# Patient Record
Sex: Male | Born: 1990 | Race: White | Hispanic: No | Marital: Single | Smoking: Never smoker
Health system: Southern US, Community
[De-identification: ages and names within clinical notes are randomized; demographics above are authoritative.]

## PROBLEM LIST (undated history)

## (undated) DIAGNOSIS — N289 Disorder of kidney and ureter, unspecified: Secondary | ICD-10-CM

## (undated) DIAGNOSIS — I10 Essential (primary) hypertension: Secondary | ICD-10-CM

## (undated) DIAGNOSIS — K760 Fatty (change of) liver, not elsewhere classified: Secondary | ICD-10-CM

## (undated) DIAGNOSIS — J45909 Unspecified asthma, uncomplicated: Secondary | ICD-10-CM

## (undated) DIAGNOSIS — E782 Mixed hyperlipidemia: Secondary | ICD-10-CM

## (undated) DIAGNOSIS — Z905 Acquired absence of kidney: Secondary | ICD-10-CM

## (undated) HISTORY — DX: Mixed hyperlipidemia: E78.2

## (undated) HISTORY — DX: Essential (primary) hypertension: I10

## (undated) HISTORY — DX: Acquired absence of kidney: Z90.5

## (undated) HISTORY — PX: NEPHRECTOMY: SHX65

## (undated) HISTORY — DX: Fatty (change of) liver, not elsewhere classified: K76.0

---
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
## 2011-12-21 HISTORY — PX: NEPHRECTOMY: SHX65

## 2012-06-18 ENCOUNTER — Encounter (HOSPITAL_COMMUNITY): Payer: Self-pay

## 2012-06-18 ENCOUNTER — Emergency Department (INDEPENDENT_AMBULATORY_CARE_PROVIDER_SITE_OTHER)
Admission: EM | Admit: 2012-06-18 | Discharge: 2012-06-18 | Disposition: A | Discharge status: Home / Self Care | Payer: Medicaid Other | Source: Home / Self Care

## 2012-06-18 DIAGNOSIS — R42 Dizziness and giddiness: Secondary | ICD-10-CM

## 2012-06-18 DIAGNOSIS — J209 Acute bronchitis, unspecified: Secondary | ICD-10-CM

## 2012-06-18 DIAGNOSIS — R030 Elevated blood-pressure reading, without diagnosis of hypertension: Secondary | ICD-10-CM

## 2012-06-18 HISTORY — DX: Unspecified asthma, uncomplicated: J45.909

## 2012-06-18 LAB — POCT I-STAT, CHEM 8
Calcium, Ion: 1.24 mmol/L (ref 1.12–1.32)
Chloride: 102 mEq/L (ref 96–112)
Creatinine, Ser: 1 mg/dL (ref 0.50–1.35)
Glucose, Bld: 107 mg/dL — ABNORMAL HIGH (ref 70–99)
Potassium: 3.4 mEq/L — ABNORMAL LOW (ref 3.5–5.1)

## 2012-06-18 MED ORDER — ALBUTEROL SULFATE HFA 108 (90 BASE) MCG/ACT IN AERS: 1.0000 | INHALATION_SPRAY | Freq: Four times a day (QID) | RESPIRATORY_TRACT | Status: DC | PRN

## 2012-06-18 NOTE — ED Notes (Signed)
NP Chatten received EKG printout

## 2012-06-18 NOTE — ED Notes (Signed)
Pt has dizziness, fatigue and elevated b/p for one week. Grandmother thinks he has swimmers ear and he denies pain.

## 2012-06-18 NOTE — Discharge Instructions (Signed)
Acute Bronchitis You have acute bronchitis. This means you have a chest cold. The airways in your lungs are red and sore (inflamed). Acute means it is sudden onset.  CAUSES Bronchitis is most often caused by the same virus that causes a cold. SYMPTOMS   Body aches.   Chest congestion.   Chills.   Cough.   Fever.   Shortness of breath.   Sore throat.  TREATMENT  Acute bronchitis is usually treated with rest, fluids, and medicines for relief of fever or cough. Most symptoms should go away after a few days or a week. Increased fluids may help thin your secretions and will prevent dehydration. Your caregiver may give you an inhaler to improve your symptoms. The inhaler reduces shortness of breath and helps control cough. You can take over-the-counter pain relievers or cough medicine to decrease coughing, pain, or fever. A cool-air vaporizer may help thin bronchial secretions and make it easier to clear your chest. Antibiotics are usually not needed but can be prescribed if you smoke, are seriously ill, have chronic lung problems, are elderly, or you are at higher risk for developing complications.Allergies and asthma can make bronchitis worse. Repeated episodes of bronchitis may cause longstanding lung problems. Avoid smoking and secondhand smoke.Exposure to cigarette smoke or irritating chemicals will make bronchitis worse. If you are a cigarette smoker, consider using nicotine gum or skin patches to help control withdrawal symptoms. Quitting smoking will help your lungs heal faster. Recovery from bronchitis is often slow, but you should start feeling better after 2 to 3 days. Cough from bronchitis frequently lasts for 3 to 4 weeks. To prevent another bout of acute bronchitis:  Quit smoking.   Wash your hands frequently to get rid of viruses or use a hand sanitizer.   Avoid other people with cold or virus symptoms.   Try not to touch your hands to your mouth, nose, or eyes.  SEEK  IMMEDIATE MEDICAL CARE IF:  You develop increased fever, chills, or chest pain.   You have severe shortness of breath or bloody sputum.   You develop dehydration, fainting, repeated vomiting, or a severe headache.   You have no improvement after 1 week of treatment or you get worse.  MAKE SURE YOU:   Understand these instructions.   Will watch your condition.   Will get help right away if you are not doing well or get worse.  Document Released: 01/13/2005 Document Revised: 11/25/2011 Document Reviewed: 03/31/2011 [facility] Patient Information 2012 [place], [place].Dizziness Dizziness is a common problem. It is a feeling of unsteadiness or lightheadedness. You may feel like you are about to faint. Dizziness can lead to injury if you stumble or fall. A person of any age group can suffer from dizziness, but dizziness is more common in older adults. CAUSES  Dizziness can be caused by many different things, including:  Middle ear problems.   Standing for too long.   Infections.   An allergic reaction.   Aging.   An emotional response to something, such as the sight of blood.   Side effects of medicines.   Fatigue.   Problems with circulation or blood pressure.   Excess use of alcohol, medicines, or illegal drug use.   Breathing too fast (hyperventilation).   An arrhythmia or problems with your heart rhythm.   Low red blood cell count (anemia).   Pregnancy.   Vomiting, diarrhea, fever, or other illnesses that cause dehydration.   Diseases or conditions such as Parkinson's disease, high  blood pressure (hypertension), diabetes, and thyroid problems.   Exposure to extreme heat.  DIAGNOSIS  To find the cause of your dizziness, your caregiver may do a physical exam, lab tests, radiologic imaging scans, or an electrocardiography test (ECG).  TREATMENT  Treatment of dizziness depends on the cause of your symptoms and can vary greatly. HOME CARE INSTRUCTIONS   Drink  enough fluids to keep your urine clear or pale yellow. This is especially important in very hot weather. In the elderly, it is also important in cold weather.   If your dizziness is caused by medicines, take them exactly as directed. When taking blood pressure medicines, it is especially important to get up slowly.   Rise slowly from chairs and steady yourself until you feel okay.   In the morning, first sit up on the side of the bed. When this seems okay, stand slowly while holding onto something until you know your balance is fine.   If you need to stand in one place for a long time, be sure to move your legs often. Tighten and relax the muscles in your legs while standing.   If dizziness continues to be a problem, have someone stay with you for a day or two. Do this until you feel you are well enough to stay alone. Have the person call your caregiver if he or she notices changes in you that are concerning.   Do not drive or use heavy machinery if you feel dizzy.  SEEK IMMEDIATE MEDICAL CARE IF:   Your dizziness or lightheadedness gets worse.   You feel nauseous or vomit.   You develop problems with talking, walking, weakness, or using your arms, hands, or legs.   You are not thinking clearly or you have difficulty forming sentences. It may take a friend or family member to determine if your thinking is normal.   You develop chest pain, abdominal pain, shortness of breath, or sweating.   Your vision changes.   You notice any bleeding.   You have side effects from medicine that seems to be getting worse rather than better.  MAKE SURE YOU:   Understand these instructions.   Will watch your condition.   Will get help right away if you are not doing well or get worse.  Document Released: 06/01/2001 Document Revised: 11/25/2011 Document Reviewed: 06/25/2011 [facility] Patient Information 2012 [place], [place].Dizziness Dizziness is a common problem. It is a feeling of unsteadiness or  lightheadedness. You may feel like you are about to faint. Dizziness can lead to injury if you stumble or fall. A person of any age group can suffer from dizziness, but dizziness is more common in older adults. CAUSES  Dizziness can be caused by many different things, including:  Middle ear problems.   Standing for too long.   Infections.   An allergic reaction.   Aging.   An emotional response to something, such as the sight of blood.   Side effects of medicines.   Fatigue.   Problems with circulation or blood pressure.   Excess use of alcohol, medicines, or illegal drug use.   Breathing too fast (hyperventilation).   An arrhythmia or problems with your heart rhythm.   Low red blood cell count (anemia).   Pregnancy.   Vomiting, diarrhea, fever, or other illnesses that cause dehydration.   Diseases or conditions such as Parkinson's disease, high blood pressure (hypertension), diabetes, and thyroid problems.   Exposure to extreme heat.  DIAGNOSIS  To find the cause  of your dizziness, your caregiver may do a physical exam, lab tests, radiologic imaging scans, or an electrocardiography test (ECG).  TREATMENT  Treatment of dizziness depends on the cause of your symptoms and can vary greatly. HOME CARE INSTRUCTIONS   Drink enough fluids to keep your urine clear or pale yellow. This is especially important in very hot weather. In the elderly, it is also important in cold weather.   If your dizziness is caused by medicines, take them exactly as directed. When taking blood pressure medicines, it is especially important to get up slowly.   Rise slowly from chairs and steady yourself until you feel okay.   In the morning, first sit up on the side of the bed. When this seems okay, stand slowly while holding onto something until you know your balance is fine.   If you need to stand in one place for a long time, be sure to move your legs often. Tighten and relax the muscles in  your legs while standing.   If dizziness continues to be a problem, have someone stay with you for a day or two. Do this until you feel you are well enough to stay alone. Have the person call your caregiver if he or she notices changes in you that are concerning.   Do not drive or use heavy machinery if you feel dizzy.  SEEK IMMEDIATE MEDICAL CARE IF:   Your dizziness or lightheadedness gets worse.   You feel nauseous or vomit.   You develop problems with talking, walking, weakness, or using your arms, hands, or legs.   You are not thinking clearly or you have difficulty forming sentences. It may take a friend or family member to determine if your thinking is normal.   You develop chest pain, abdominal pain, shortness of breath, or sweating.   Your vision changes.   You notice any bleeding.   You have side effects from medicine that seems to be getting worse rather than better.  MAKE SURE YOU:   Understand these instructions.   Will watch your condition.   Will get help right away if you are not doing well or get worse.  Document Released: 06/01/2001 Document Revised: 11/25/2011 Document Reviewed: 06/25/2011 [facility] Patient Information 2012 [place], [place].

## 2012-06-18 NOTE — ED Provider Notes (Signed)
History     CSN: [id number]  Arrival date & time 06/18/12  1127   None     Chief Complaint  Patient presents with  . Dizziness    (Consider location/radiation/quality/duration/timing/severity/associated sxs/prior treatment) The history is provided by the patient and a relative.  Patient complains of a 3 week history of intermittent dizziness.  States no known aggravating factors, is not able to reproduce with movement, reports yesterday while in garage cleaning out freezer with his grandmother noted, dizziness and felt like he was going to pass out.  Grandmother checked blood pressure and pulse and noted both to be elevated.  Symptoms lasted less than fifteen minutes after laying down.  Denies associated chest pain/pressure, no nausea noted, denies diaphoresis, no known history of heart conditions.  Pt reports history of asthma.  Does report for the last two days he has had an intermittent cough that worsens at night and sensation of sob at night with wheezing.  Currently prescribed albuterol but not taking.   Past Medical History  Diagnosis Date  . Asthma     Past Surgical History  Procedure Date  . Nephrectomy     History reviewed. No pertinent family history.  History  Substance Use Topics  . Smoking status: Never Smoker   . Smokeless tobacco: Not on file  . Alcohol Use: No      Review of Systems  All other systems reviewed and are negative.    Allergies  Review of patient's allergies indicates no known allergies.  Home Medications   Current Outpatient Rx  Name Route Sig Dispense Refill  . ACETAMINOPHEN 325 MG PO TABS Oral Take 650 mg by mouth every 6 (six) hours as needed.    . ALBUTEROL SULFATE HFA 108 (90 BASE) MCG/ACT IN AERS Inhalation Inhale 2 puffs into the lungs every 6 (six) hours as needed.    Marland Kitchen NAPROXEN SODIUM 220 MG PO TABS Oral Take 220 mg by mouth 2 (two) times daily with a meal.      BP 147/88  Pulse 98  Temp 98.5 F (36.9 C) (Oral)  Resp  18  SpO2 98%  Physical Exam  Nursing note and vitals reviewed. Constitutional: He is oriented to person, place, and time. Vital signs are normal. He appears well-developed and well-nourished. He is active and cooperative.  HENT:  Head: Normocephalic.  Right Ear: Hearing, tympanic membrane, external ear and ear canal normal.  Left Ear: Hearing, tympanic membrane, external ear and ear canal normal.  Nose: Nose normal.  Mouth/Throat: Uvula is midline, oropharynx is clear and moist and mucous membranes are normal.  Eyes: Conjunctivae, EOM and lids are normal. Pupils are equal, round, and reactive to light. No scleral icterus.  Neck: Trachea normal and normal range of motion. Neck supple.  Cardiovascular: Normal rate, regular rhythm, normal heart sounds, intact distal pulses and normal pulses.   Pulmonary/Chest: Effort normal and breath sounds normal.  Lymphadenopathy:    He has no cervical adenopathy.  Neurological: He is alert and oriented to person, place, and time. He has normal strength. No cranial nerve deficit or sensory deficit. GCS eye subscore is 4. GCS verbal subscore is 5. GCS motor subscore is 6.  Skin: Skin is warm, dry and intact.  Psychiatric: He has a normal mood and affect. His speech is normal and behavior is normal. Judgment and thought content normal. Cognition and memory are normal.    ED Course  Procedures (including critical care time)  Labs Reviewed  POCT I-STAT,  CHEM 8 - Abnormal; Notable for the following:    Potassium 3.4 (*)     Glucose, Bld 107 (*)     All other components within normal limits   No results found.   1. Acute bronchitis   2. Elevated BP   3. Dizziness       MDM  Care discussed with Dr. Artis Flock.  Albuterol for cough, increase fluids, recheck your blood pressure in one week to ensure it is has returned to normal, follow up with pcp for further evaluation if dizziness persist, return immediately for cardiac symptoms as discussed.           Johnsie Kindred, NP 06/18/12 1434

## 2012-06-19 NOTE — ED Provider Notes (Signed)
Medical screening examination/treatment/procedure(s) were performed by resident physician or non-physician practitioner and as supervising physician I was immediately available for consultation/collaboration.   Jazalyn Mondor DOUGLAS MD.    Bryley Chrisman D Sierrah Luevano, MD 06/19/12 2024 

## 2013-06-01 ENCOUNTER — Emergency Department (HOSPITAL_BASED_OUTPATIENT_CLINIC_OR_DEPARTMENT_OTHER)
Admission: EM | Admit: 2013-06-01 | Discharge: 2013-06-01 | Disposition: A | Discharge status: Home / Self Care | Payer: Self-pay | Attending: Emergency Medicine | Admitting: Emergency Medicine

## 2013-06-01 ENCOUNTER — Emergency Department (HOSPITAL_BASED_OUTPATIENT_CLINIC_OR_DEPARTMENT_OTHER): Payer: Self-pay

## 2013-06-01 ENCOUNTER — Encounter (HOSPITAL_BASED_OUTPATIENT_CLINIC_OR_DEPARTMENT_OTHER): Payer: Self-pay | Admitting: Emergency Medicine

## 2013-06-01 DIAGNOSIS — W1809XA Striking against other object with subsequent fall, initial encounter: Secondary | ICD-10-CM | POA: Insufficient documentation

## 2013-06-01 DIAGNOSIS — Y92838 Other recreation area as the place of occurrence of the external cause: Secondary | ICD-10-CM | POA: Insufficient documentation

## 2013-06-01 DIAGNOSIS — Z23 Encounter for immunization: Secondary | ICD-10-CM | POA: Insufficient documentation

## 2013-06-01 DIAGNOSIS — Y9317 Activity, water skiing and wake boarding: Secondary | ICD-10-CM | POA: Insufficient documentation

## 2013-06-01 DIAGNOSIS — S0101XA Laceration without foreign body of scalp, initial encounter: Secondary | ICD-10-CM

## 2013-06-01 DIAGNOSIS — S0100XA Unspecified open wound of scalp, initial encounter: Secondary | ICD-10-CM | POA: Insufficient documentation

## 2013-06-01 DIAGNOSIS — Y9239 Other specified sports and athletic area as the place of occurrence of the external cause: Secondary | ICD-10-CM | POA: Insufficient documentation

## 2013-06-01 DIAGNOSIS — J45909 Unspecified asthma, uncomplicated: Secondary | ICD-10-CM | POA: Insufficient documentation

## 2013-06-01 DIAGNOSIS — Z79899 Other long term (current) drug therapy: Secondary | ICD-10-CM | POA: Insufficient documentation

## 2013-06-01 MED ORDER — TRAMADOL HCL 50 MG PO TABS
50.0000 mg | ORAL_TABLET | Freq: Once | ORAL | Status: AC
  Administered 2013-06-01: 50 mg via ORAL

## 2013-06-01 MED ORDER — TETANUS-DIPHTH-ACELL PERTUSSIS 5-2.5-18.5 LF-MCG/0.5 IM SUSP
0.5000 mL | Freq: Once | INTRAMUSCULAR | Status: AC
  Administered 2013-06-01: 0.5 mL via INTRAMUSCULAR
  Filled 2013-06-01: qty 0.5

## 2013-06-01 MED ORDER — LIDOCAINE-EPINEPHRINE 2 %-1:100000 IJ SOLN
1.7000 mL | Freq: Once | INTRAMUSCULAR | Status: AC
  Administered 2013-06-01: 1.7 mL via INTRADERMAL
  Filled 2013-06-01: qty 1

## 2013-06-01 MED ORDER — TRAMADOL HCL 50 MG PO TABS: 50.0000 mg | ORAL_TABLET | Freq: Four times a day (QID) | ORAL | Status: DC | PRN

## 2013-06-01 NOTE — ED Notes (Addendum)
4cm laceration to R temporal-occipital head, from [place]. Pt denies LOC. Tinnitis to bilateral ears initially, but not now. Pain in temporal and jaw area when opening mouth. Unknown last tetanus shot.

## 2013-06-01 NOTE — ED Notes (Signed)
Suture cart is at the bedside set up and ready for the doctor to use. 

## 2013-06-01 NOTE — ED Provider Notes (Signed)
History     CSN: [id number]  Arrival date & time 06/01/13  1246   First MD Initiated Contact with Patient 06/01/13 1257      Chief Complaint  Patient presents with  . Ear Laceration    (Consider location/radiation/quality/duration/timing/severity/associated sxs/prior treatment) HPI Comments: Patient is a 22 year old male who presents with a scalp laceration that occurred prior to arrival when he was wake boarding. Patient reports wake boarding and falling, striking his head on the wake board. Patient denies LOC. He reports a laceration of the right side of his head with associated bleeding. The area is painful described as throbbing and severe. No alleviating factors. Palpation of the area makes the pain worse.    Past Medical History  Diagnosis Date  . Asthma     Past Surgical History  Procedure Laterality Date  . Nephrectomy      No family history on file.  History  Substance Use Topics  . Smoking status: Never Smoker   . Smokeless tobacco: Not on file  . Alcohol Use: No      Review of Systems  Skin: Positive for wound.  All other systems reviewed and are negative.    Allergies  Review of patient's allergies indicates no known allergies.  Home Medications   Current Outpatient Rx  Name  Route  Sig  Dispense  Refill  . acetaminophen (TYLENOL) 325 MG tablet   Oral   Take 650 mg by mouth every 6 (six) hours as needed.         Marland Kitchen albuterol (PROVENTIL HFA;VENTOLIN HFA) 108 (90 BASE) MCG/ACT inhaler   Inhalation   Inhale 2 puffs into the lungs every 6 (six) hours as needed.         Marland Kitchen albuterol (PROVENTIL HFA;VENTOLIN HFA) 108 (90 BASE) MCG/ACT inhaler   Inhalation   Inhale 1-2 puffs into the lungs every 6 (six) hours as needed for wheezing.   1 Inhaler   2     BP 140/87  Pulse 86  Temp(Src) 98.1 F (36.7 C) (Oral)  Resp 16  SpO2 100%  Physical Exam  Nursing note and vitals reviewed. Constitutional: He is oriented to person, place, and time.  He appears well-developed and well-nourished. No distress.  HENT:  Head: Normocephalic and atraumatic.  3.5 cm laceration to right temporal region. Bleeding controlled. Surrounding area tender to palpation.   Eyes: Conjunctivae and EOM are normal.  Neck: Normal range of motion.  Cardiovascular: Normal rate and regular rhythm.  Exam reveals no gallop and no friction rub.   No murmur heard. Pulmonary/Chest: Effort normal and breath sounds normal. He has no wheezes. He has no rales. He exhibits no tenderness.  Abdominal: Soft. There is no tenderness.  Musculoskeletal: Normal range of motion.  Neurological: He is alert and oriented to person, place, and time. Coordination normal.  Speech is goal-oriented. Moves limbs without ataxia.   Skin: Skin is warm and dry.  Psychiatric: He has a normal mood and affect. His behavior is normal.    ED Course  Procedures (including critical care time)  LACERATION REPAIR Performed by: Emilia Beck Authorized by: Emilia Beck Consent: Verbal consent obtained. Risks and benefits: risks, benefits and alternatives were discussed Consent given by: patient Patient identity confirmed: provided demographic data Prepped and Draped in normal sterile fashion Wound explored  Laceration Location: right temporal area  Laceration Length: 3.5 cm  No Foreign Bodies seen or palpated  Anesthesia: local infiltration  Local anesthetic: lidocaine 2% with epinephrine  Anesthetic total: 2 ml  Irrigation method: syringe Amount of cleaning: standard  Skin closure: staples  Number of sutures: 4 staples  Technique: n/a  Patient tolerance: Patient tolerated the procedure well with no immediate complications.   Labs Reviewed - No data to display Ct Head Wo Contrast  06/01/2013   *RADIOLOGY REPORT*  Clinical Data:  Hit on side of head with a wake board.  Head pain. Facial pain.  Painful to open and close mouth.  CT HEAD WITHOUT CONTRAST CT  MAXILLOFACIAL WITHOUT CONTRAST  Technique:  Multidetector CT imaging of the head and maxillofacial structures were performed using the standard protocol without intravenous contrast. Multiplanar CT image reconstructions of the maxillofacial structures were also generated.  Comparison:   None.  CT HEAD  Findings: There is no evidence for acute infarction, intracranial hemorrhage, mass lesion, hydrocephalus, or extra-axial fluid. There is no atrophy or white matter disease.  Multiple staples are associated with a right temporal laceration and scalp hematoma. There is no underlying skull fracture.  Sinuses and mastoids demonstrate no acute air-fluid levels.  IMPRESSION: Right temporal scalp laceration.  No skull fracture or intracranial hemorrhage.  CT MAXILLOFACIAL  Findings:   There is no visible facial fracture or dislocation. Mandible, maxilla, and temporomandibular joints are intact.  There is mild fluid accumulation in the left frontal, left ethmoid, left sphenoid, and left maxillary sinuses consistent with chronic sinus disease rather than an acute abnormality.  There is no inferior or medial blowout fracture.  The orbits are negative.  Parapharyngeal fat planes are preserved.  Airway is midline.  No nasal cavity masses.  IMPRESSION: No visible facial fracture. There is no TMJ displacement.  Chronic sinus disease on the left.   Original Report Authenticated By: Davonna Belling, M.D.   Ct Maxillofacial Wo Cm  06/01/2013   *RADIOLOGY REPORT*  Clinical Data:  Hit on side of head with a wake board.  Head pain. Facial pain.  Painful to open and close mouth.  CT HEAD WITHOUT CONTRAST CT MAXILLOFACIAL WITHOUT CONTRAST  Technique:  Multidetector CT imaging of the head and maxillofacial structures were performed using the standard protocol without intravenous contrast. Multiplanar CT image reconstructions of the maxillofacial structures were also generated.  Comparison:   None.  CT HEAD  Findings: There is no evidence for  acute infarction, intracranial hemorrhage, mass lesion, hydrocephalus, or extra-axial fluid. There is no atrophy or white matter disease.  Multiple staples are associated with a right temporal laceration and scalp hematoma. There is no underlying skull fracture.  Sinuses and mastoids demonstrate no acute air-fluid levels.  IMPRESSION: Right temporal scalp laceration.  No skull fracture or intracranial hemorrhage.  CT MAXILLOFACIAL  Findings:   There is no visible facial fracture or dislocation. Mandible, maxilla, and temporomandibular joints are intact.  There is mild fluid accumulation in the left frontal, left ethmoid, left sphenoid, and left maxillary sinuses consistent with chronic sinus disease rather than an acute abnormality.  There is no inferior or medial blowout fracture.  The orbits are negative.  Parapharyngeal fat planes are preserved.  Airway is midline.  No nasal cavity masses.  IMPRESSION: No visible facial fracture. There is no TMJ displacement.  Chronic sinus disease on the left.   Original Report Authenticated By: Davonna Belling, M.D.     1. Scalp laceration, initial encounter       MDM  1:18 PM Patient's head laceration will be cleaned and stapled. Patient will have tramadol for pain. Patient denies LOC. I will  not order CT scan at this time.   1:34 PM Laceration repaired without difficulty. Patient will have TDap. Patient instructed to return in 5 days for staple removal. Vitals stable and patient afebrile. Patient instructed to return with worsening or concerning symptoms.   1:54 PM Patient now complaining of jaw pain. Patient will have head and face CT for further evaluation.   2:36 PM Imaging unremarkable for acute changes. Patient will be discharged without further evaluation.   Emilia Beck, PA-C 06/01/13 1337  Emilia Beck, PA-C 06/01/13 1436

## 2013-06-01 NOTE — ED Provider Notes (Signed)
Medical screening examination/treatment/procedure(s) were performed by non-physician practitioner and as supervising physician I was immediately available for consultation/collaboration.  Vona Whiters, MD 06/01/13 1451 

## 2013-06-06 ENCOUNTER — Emergency Department (HOSPITAL_BASED_OUTPATIENT_CLINIC_OR_DEPARTMENT_OTHER)
Admission: EM | Admit: 2013-06-06 | Discharge: 2013-06-06 | Disposition: A | Discharge status: Home / Self Care | Payer: Self-pay | Attending: Emergency Medicine | Admitting: Emergency Medicine

## 2013-06-06 ENCOUNTER — Encounter (HOSPITAL_BASED_OUTPATIENT_CLINIC_OR_DEPARTMENT_OTHER): Payer: Self-pay

## 2013-06-06 DIAGNOSIS — Z905 Acquired absence of kidney: Secondary | ICD-10-CM | POA: Insufficient documentation

## 2013-06-06 DIAGNOSIS — J45909 Unspecified asthma, uncomplicated: Secondary | ICD-10-CM | POA: Insufficient documentation

## 2013-06-06 DIAGNOSIS — Z4802 Encounter for removal of sutures: Secondary | ICD-10-CM | POA: Insufficient documentation

## 2013-06-06 DIAGNOSIS — Z79899 Other long term (current) drug therapy: Secondary | ICD-10-CM | POA: Insufficient documentation

## 2013-06-06 NOTE — ED Provider Notes (Signed)
Medical screening examination/treatment/procedure(s) were performed by non-physician practitioner and as supervising physician I was immediately available for consultation/collaboration.   Niccolo Burggraf J. Gizzelle Lacomb, MD 06/06/13 1804 

## 2013-06-06 NOTE — ED Provider Notes (Signed)
History     CSN: [id number]  Arrival date & time 06/06/13  1618   First MD Initiated Contact with Patient 06/06/13 1627      Chief Complaint  Patient presents with  . Wound Check    (Consider location/radiation/quality/duration/timing/severity/associated sxs/prior treatment) HPI Comments: Pt has staples placed 5 days ago to his scalp:pt denies any problems to the area  The history is provided by the patient. No language interpreter was used.    Past Medical History  Diagnosis Date  . Asthma     Past Surgical History  Procedure Laterality Date  . Nephrectomy      History reviewed. No pertinent family history.  History  Substance Use Topics  . Smoking status: Never Smoker   . Smokeless tobacco: Not on file  . Alcohol Use: No      Review of Systems  Constitutional: Negative.   Respiratory: Negative.   Cardiovascular: Negative.     Allergies  Review of patient's allergies indicates no known allergies.  Home Medications   Current Outpatient Rx  Name  Route  Sig  Dispense  Refill  . acetaminophen (TYLENOL) 325 MG tablet   Oral   Take 650 mg by mouth every 6 (six) hours as needed.         Marland Kitchen albuterol (PROVENTIL HFA;VENTOLIN HFA) 108 (90 BASE) MCG/ACT inhaler   Inhalation   Inhale 2 puffs into the lungs every 6 (six) hours as needed.         Marland Kitchen albuterol (PROVENTIL HFA;VENTOLIN HFA) 108 (90 BASE) MCG/ACT inhaler   Inhalation   Inhale 1-2 puffs into the lungs every 6 (six) hours as needed for wheezing.   1 Inhaler   2   . traMADol (ULTRAM) 50 MG tablet   Oral   Take 1 tablet (50 mg total) by mouth every 6 (six) hours as needed for pain.   15 tablet   0     BP 158/81  Pulse 87  Temp(Src) 99.6 F (37.6 C) (Oral)  Resp 16  Ht 5\' 5"  (1.651 m)  Wt 140 lb (63.504 kg)  BMI 23.3 kg/m2  SpO2 98%  Physical Exam  Nursing note and vitals reviewed. Constitutional: He appears well-developed and well-nourished.  Cardiovascular: Normal rate and  regular rhythm.   Pulmonary/Chest: Effort normal and breath sounds normal.  Skin:  Well healing wound to the right scalp    ED Course  SUTURE REMOVAL Date/Time: 06/06/2013 4:34 PM Performed by: Teressa Lower Authorized by: Teressa Lower Time out: Immediately prior to procedure a "time out" was called to verify the correct patient, procedure, equipment, support staff and site/side marked as required. Body area: head/neck Location details: scalp Wound Appearance: clean Staples Removed: 4 Facility: sutures placed in this facility Patient tolerance: Patient tolerated the procedure well with no immediate complications.   (including critical care time)  Labs Reviewed - No data to display No results found.   1. Removal of staple       MDM  Staples removed:no infection noted        Teressa Lower, NP 06/06/13 1635

## 2013-06-06 NOTE — ED Notes (Signed)
Pt presents today for staple removal, wound well approximated, healing well.
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
## 2016-12-20 HISTORY — PX: WISDOM TOOTH EXTRACTION: SHX21

## 2017-12-08 ENCOUNTER — Encounter (HOSPITAL_BASED_OUTPATIENT_CLINIC_OR_DEPARTMENT_OTHER): Payer: Self-pay

## 2017-12-08 ENCOUNTER — Other Ambulatory Visit: Payer: Self-pay

## 2017-12-08 ENCOUNTER — Emergency Department (HOSPITAL_BASED_OUTPATIENT_CLINIC_OR_DEPARTMENT_OTHER)
Admission: EM | Admit: 2017-12-08 | Discharge: 2017-12-08 | Disposition: A | Discharge status: Home / Self Care | Payer: BLUE CROSS/BLUE SHIELD | Attending: Emergency Medicine | Admitting: Emergency Medicine

## 2017-12-08 DIAGNOSIS — K029 Dental caries, unspecified: Secondary | ICD-10-CM | POA: Insufficient documentation

## 2017-12-08 DIAGNOSIS — J45909 Unspecified asthma, uncomplicated: Secondary | ICD-10-CM | POA: Insufficient documentation

## 2017-12-08 DIAGNOSIS — K0889 Other specified disorders of teeth and supporting structures: Secondary | ICD-10-CM | POA: Diagnosis present

## 2017-12-08 MED ORDER — LIDOCAINE VISCOUS 2 % MT SOLN: 15.0000 mL | Freq: Once | OROMUCOSAL | Status: DC

## 2017-12-08 MED ORDER — PENICILLIN V POTASSIUM 250 MG PO TABS
500.0000 mg | ORAL_TABLET | Freq: Once | ORAL | Status: AC
  Administered 2017-12-08: 500 mg via ORAL
  Filled 2017-12-08: qty 2

## 2017-12-08 MED ORDER — PENICILLIN V POTASSIUM 500 MG PO TABS: 500.0000 mg | ORAL_TABLET | Freq: Four times a day (QID) | ORAL | 0 refills | Status: AC

## 2017-12-08 MED ORDER — IBUPROFEN 800 MG PO TABS: 800.0000 mg | ORAL_TABLET | Freq: Once | ORAL | Status: DC

## 2017-12-08 NOTE — ED Notes (Signed)
Prior to arrival, pt's girlfriend gave 2 tylenol and then apparently "got scared" d/t patient's pain, and so she decided to give him an adult dose of the liquid tylenol as well.  Thoroughly advised pt and girlfriend against double dosing on medications and to take an appropriate dose from now on. EMT and RN at bedside for help with holding pt still for dental block. Upon further examination, EDP found several broken teeth as well as a necrotic tooth with the root exposed.  Pt is advised to call a dentist and try to get in as soon as possible; they state they will try the emergency dentist today.  Deny any further needs at this time.

## 2017-12-08 NOTE — ED Provider Notes (Signed)
[facility] EMERGENCY DEPARTMENT Provider Note   CSN: [id number] Arrival date & time: 12/08/17  [id number]     History   Chief Complaint No chief complaint on file.   HPI Peter Meadows is a 26 y.o. male.  The history is provided by the patient.  Dental Pain   This is a new problem. The current episode started 1 to 2 hours ago. The problem occurs constantly. The problem has not changed since onset.The pain is at a severity of 10/10. The pain is severe. He has tried acetaminophen for the symptoms. The treatment provided no relief.  Knew that he had bad wisdom teeth but has not followed up.    Past Medical History:  Diagnosis Date  . Asthma     There are no active problems to display for this patient.   Past Surgical History:  Procedure Laterality Date  . NEPHRECTOMY         Home Medications    Prior to Admission medications   Medication Sig Start Date End Date Taking? Authorizing Provider  acetaminophen (TYLENOL) 325 MG tablet Take 650 mg by mouth every 6 (six) hours as needed.    [provider]  albuterol (PROVENTIL HFA;VENTOLIN HFA) 108 (90 BASE) MCG/ACT inhaler Inhale 2 puffs into the lungs every 6 (six) hours as needed.    [provider]  albuterol (PROVENTIL HFA;VENTOLIN HFA) 108 (90 BASE) MCG/ACT inhaler Inhale 1-2 puffs into the lungs every 6 (six) hours as needed for wheezing. 06/18/12 06/18/13  Chatten, Katherine Bassetarmen L, NP  traMADol (ULTRAM) 50 MG tablet Take 1 tablet (50 mg total) by mouth every 6 (six) hours as needed for pain. 06/01/13   Emilia BeckSzekalski, Kaitlyn, PA-C    Family History No family history on file.  Social History Social History   Tobacco Use  . Smoking status: Never Smoker  Substance Use Topics  . Alcohol use: No  . Drug use: No     Allergies   Patient has no known allergies.   Review of Systems Review of Systems  Constitutional: Negative for appetite change and fever.  HENT: Positive for dental problem.  Negative for drooling, facial swelling, mouth sores and postnasal drip.   Eyes: Negative for photophobia.  Respiratory: Negative for shortness of breath.   Gastrointestinal: Negative for abdominal pain.  Musculoskeletal: Negative for neck pain.  All other systems reviewed and are negative.    Physical Exam Updated Vital Signs There were no vitals taken for this visit.  Physical Exam  Constitutional: He is oriented to person, place, and time. He appears well-developed and well-nourished. No distress.  HENT:  Head: Normocephalic and atraumatic.  Mouth/Throat: No trismus in the jaw. Abnormal dentition. Dental caries present. No uvula swelling. No oropharyngeal exudate.    Normal jaw excursion no pain with palpation of the jaw.  Midface is stable and non tender  Eyes: Pupils are equal, round, and reactive to light.  Neck: Normal range of motion. Neck supple. No tracheal deviation present.  Cardiovascular: Normal rate, regular rhythm, normal heart sounds and intact distal pulses.  Pulmonary/Chest: Effort normal and breath sounds normal. He has no wheezes.  Abdominal: Soft. Bowel sounds are normal. There is no tenderness. There is no guarding.  Musculoskeletal: Normal range of motion.  Lymphadenopathy:    He has no cervical adenopathy.  Neurological: He is alert and oriented to person, place, and time. He displays normal reflexes.  Skin: Skin is warm and dry. Capillary refill takes less than 2 seconds.  Nursing note and vitals reviewed.    ED Treatments / Results    Procedures Dental Block Date/Time: 12/08/2017 5:37 AM Performed by: Cy BlamerPalumbo, Alivea Gladson, MD Authorized by: Cy BlamerPalumbo, Lurleen Soltero, MD   Consent:    Consent obtained:  Verbal   Consent given by:  Patient   Risks discussed:  Infection, hematoma and intravascular injection   Alternatives discussed:  No treatment Indications:    Indications: dental pain   Location:    Block type:  Posterior superior alveolar Procedure  details (see MAR for exact dosages):    Topical anesthetic:  Lidocaine gel   Syringe type:  Controlled syringe   Needle gauge:  27 G   Anesthetic injected:  Bupivacaine 0.25% WITH epi   Injection procedure:  Anatomic landmarks identified, introduced needle, negative aspiration for blood, anatomic landmarks palpated and incremental injection Post-procedure details:    Outcome:  Anesthesia achieved   Patient tolerance of procedure:  Tolerated well, no immediate complications   (including critical care time)  Medications Ordered in ED Medications  penicillin v potassium (VEETID) tablet 500 mg (not administered)  ibuprofen (ADVIL,MOTRIN) tablet 800 mg (not administered)      Final Clinical Impressions(s) / ED Diagnoses   Follow up with dentistry for ongoing care. Started antibiotics.  Return for facial swelling, neck swelling, fevers, inability to open the  vomiting, or diarrhea, abdominal pain, Inability to tolerate liquids or food, cough, altered mental status or any concerns. No signs of systemic illness or infection. The patient is nontoxic-appearing on exam and vital signs are within normal limits.    I have reviewed the triage vital signs and the nursing notes. Pertinent labs &imaging results that were available during my care of the patient were reviewed by me and considered in my medical decision making (see chart for details).  After history, exam, and medical workup I feel the patient has been appropriately medically screened and is safe for discharge home. Pertinent diagnoses were discussed with the patient. Patient was given return precautions   Aletheia Tangredi, MD 12/08/17 [id number]

## 2017-12-08 NOTE — ED Triage Notes (Signed)
Pt c/o right side dental pain that woke him from sleep, took a double dose of tylenol and 2 ibuprofen prior to arrival without relief

## 2018-04-07 ENCOUNTER — Emergency Department (HOSPITAL_BASED_OUTPATIENT_CLINIC_OR_DEPARTMENT_OTHER)
Admission: EM | Admit: 2018-04-07 | Discharge: 2018-04-07 | Disposition: A | Discharge status: Home / Self Care | Payer: BLUE CROSS/BLUE SHIELD | Attending: Physician Assistant | Admitting: Physician Assistant

## 2018-04-07 ENCOUNTER — Other Ambulatory Visit: Payer: Self-pay

## 2018-04-07 ENCOUNTER — Encounter (HOSPITAL_BASED_OUTPATIENT_CLINIC_OR_DEPARTMENT_OTHER): Payer: Self-pay

## 2018-04-07 ENCOUNTER — Emergency Department (HOSPITAL_BASED_OUTPATIENT_CLINIC_OR_DEPARTMENT_OTHER): Payer: BLUE CROSS/BLUE SHIELD

## 2018-04-07 DIAGNOSIS — Z79899 Other long term (current) drug therapy: Secondary | ICD-10-CM | POA: Insufficient documentation

## 2018-04-07 DIAGNOSIS — Y999 Unspecified external cause status: Secondary | ICD-10-CM | POA: Insufficient documentation

## 2018-04-07 DIAGNOSIS — Y929 Unspecified place or not applicable: Secondary | ICD-10-CM | POA: Diagnosis not present

## 2018-04-07 DIAGNOSIS — J45909 Unspecified asthma, uncomplicated: Secondary | ICD-10-CM | POA: Insufficient documentation

## 2018-04-07 DIAGNOSIS — Z23 Encounter for immunization: Secondary | ICD-10-CM | POA: Diagnosis not present

## 2018-04-07 DIAGNOSIS — Z203 Contact with and (suspected) exposure to rabies: Secondary | ICD-10-CM | POA: Insufficient documentation

## 2018-04-07 DIAGNOSIS — Y939 Activity, unspecified: Secondary | ICD-10-CM | POA: Diagnosis not present

## 2018-04-07 DIAGNOSIS — W540XXA Bitten by dog, initial encounter: Secondary | ICD-10-CM | POA: Diagnosis not present

## 2018-04-07 DIAGNOSIS — T148XXA Other injury of unspecified body region, initial encounter: Secondary | ICD-10-CM

## 2018-04-07 DIAGNOSIS — S61051A Open bite of right thumb without damage to nail, initial encounter: Secondary | ICD-10-CM | POA: Diagnosis not present

## 2018-04-07 LAB — CBC WITH DIFFERENTIAL/PLATELET
BASOS PCT: 0 %
Basophils Absolute: 0 10*3/uL (ref 0.0–0.1)
Eosinophils Absolute: 0.3 10*3/uL (ref 0.0–0.7)
Eosinophils Relative: 3 %
HEMATOCRIT: 41.4 % (ref 39.0–52.0)
Hemoglobin: 14.4 g/dL (ref 13.0–17.0)
Lymphocytes Relative: 25 %
Lymphs Abs: 2.4 10*3/uL (ref 0.7–4.0)
MCH: 29.6 pg (ref 26.0–34.0)
MCHC: 34.8 g/dL (ref 30.0–36.0)
MCV: 85 fL (ref 78.0–100.0)
MONO ABS: 1 10*3/uL (ref 0.1–1.0)
MONOS PCT: 10 %
NEUTROS ABS: 6.1 10*3/uL (ref 1.7–7.7)
Neutrophils Relative %: 62 %
Platelets: 207 10*3/uL (ref 150–400)
RBC: 4.87 MIL/uL (ref 4.22–5.81)
RDW: 13.5 % (ref 11.5–15.5)
WBC: 9.8 10*3/uL (ref 4.0–10.5)

## 2018-04-07 LAB — COMPREHENSIVE METABOLIC PANEL
ALBUMIN: 4.5 g/dL (ref 3.5–5.0)
ALK PHOS: 76 U/L (ref 38–126)
ALT: 60 U/L (ref 17–63)
AST: 41 U/L (ref 15–41)
Anion gap: 8 (ref 5–15)
BUN: 16 mg/dL (ref 6–20)
CALCIUM: 8.9 mg/dL (ref 8.9–10.3)
CO2: 22 mmol/L (ref 22–32)
Chloride: 106 mmol/L (ref 101–111)
Creatinine, Ser: 1.07 mg/dL (ref 0.61–1.24)
GFR calc Af Amer: >60 mL/min (ref >60)
GFR calc non Af Amer: >60 mL/min (ref >60)
GLUCOSE: 111 mg/dL — AB (ref 65–99)
Potassium: 3.9 mmol/L (ref 3.5–5.1)
SODIUM: 136 mmol/L (ref 135–145)
Total Bilirubin: 1.2 mg/dL (ref 0.3–1.2)
Total Protein: 7.6 g/dL (ref 6.5–8.1)

## 2018-04-07 MED ORDER — SODIUM CHLORIDE 0.9 % IV BOLUS
500.0000 mL | Freq: Once | INTRAVENOUS | Status: AC
  Administered 2018-04-07: 500 mL via INTRAVENOUS

## 2018-04-07 MED ORDER — RABIES VACCINE, PCEC IM SUSR
1.0000 mL | Freq: Once | INTRAMUSCULAR | Status: AC
  Administered 2018-04-07: 1 mL via INTRAMUSCULAR
  Filled 2018-04-07: qty 1

## 2018-04-07 MED ORDER — PENTAFLUOROPROP-TETRAFLUOROETH EX AERO: INHALATION_SPRAY | CUTANEOUS | Status: DC | PRN

## 2018-04-07 MED ORDER — PIPERACILLIN-TAZOBACTAM 3.375 G IVPB 30 MIN
3.3750 g | Freq: Once | INTRAVENOUS | Status: AC
  Administered 2018-04-07: 3.375 g via INTRAVENOUS
  Filled 2018-04-07 (×2): qty 50

## 2018-04-07 MED ORDER — PENTAFLUOROPROP-TETRAFLUOROETH EX AERO
INHALATION_SPRAY | CUTANEOUS | Status: AC
  Filled 2018-04-07: qty 30

## 2018-04-07 MED ORDER — AMOXICILLIN-POT CLAVULANATE 875-125 MG PO TABS: 1.0000 | ORAL_TABLET | Freq: Two times a day (BID) | ORAL | 0 refills | Status: DC

## 2018-04-07 MED ORDER — TETANUS-DIPHTH-ACELL PERTUSSIS 5-2.5-18.5 LF-MCG/0.5 IM SUSP
0.5000 mL | Freq: Once | INTRAMUSCULAR | Status: AC
  Administered 2018-04-07: 0.5 mL via INTRAMUSCULAR
  Filled 2018-04-07: qty 0.5

## 2018-04-07 MED ORDER — AMOXICILLIN-POT CLAVULANATE 875-125 MG PO TABS
1.0000 | ORAL_TABLET | Freq: Once | ORAL | Status: AC
  Administered 2018-04-07: 1 via ORAL
  Filled 2018-04-07: qty 1

## 2018-04-07 MED ORDER — RABIES IMMUNE GLOBULIN 150 UNIT/ML IM INJ
20.0000 [IU]/kg | INJECTION | Freq: Once | INTRAMUSCULAR | Status: AC
  Administered 2018-04-07: 1650 [IU]
  Filled 2018-04-07: qty 12

## 2018-04-07 NOTE — ED Notes (Signed)
                                  RABIES VACCINE FOLLOW UP  Patient's Name: Peter Meadows L Bolyard                     Original Order Date:04/07/2018  Medical Record Number: [id number]  ED Physician: Abelino DerrickMackuen, Courteney Lyn, * Primary Diagnosis: Rabies Exposure       PCP: Patient, No Pcp Per  Patient Phone Number: (home) [phone] (home)    (cell)  Telephone Information:  Mobile [phone]    (work) There is no work phone number on file. Species of Animal: Dog   You have been seen in the Emergency Department for a possible rabies exposure. It's very important you return for the additional vaccine doses.  Please call the clinic listed below for hours of operation.   Clinic that will administer your rabies vaccines:    DAY 0:  04/07/2018      DAY 3:  04/10/2018       DAY 7:  04/14/2018     DAY 14:  04/21/2018         The 5th vaccine injection is considered for immune compromised patients only.  DAY 28:  05/05/2018

## 2018-04-07 NOTE — ED Provider Notes (Signed)
[facility] EMERGENCY DEPARTMENT Provider Note   CSN: [id number] Arrival date & time: 04/07/18  1313     History   Chief Complaint Chief Complaint  Patient presents with  . Animal Bite    HPI ZEIN HELBING is a 27 y.o. male.  HPI   Patient is a 27 year old male presenting after animal bite.  Patient had a dog that was wild bite his right thumb around 36 hours ago.  Patient noted swelling, increased pain, and spreading redness.  Came here to be evaluated.  Patient not receive rabies in the past.  Is not up-to-date on tetanus.  Patient has unknown whereabouts of the dog.  No fever.  Past Medical History:  Diagnosis Date  . Asthma     There are no active problems to display for this patient.   Past Surgical History:  Procedure Laterality Date  . NEPHRECTOMY          Home Medications    Prior to Admission medications   Medication Sig Start Date End Date Taking? Authorizing Provider  acetaminophen (TYLENOL) 325 MG tablet Take 650 mg by mouth every 6 (six) hours as needed.    [provider]  albuterol (PROVENTIL HFA;VENTOLIN HFA) 108 (90 BASE) MCG/ACT inhaler Inhale 2 puffs into the lungs every 6 (six) hours as needed.    [provider]  albuterol (PROVENTIL HFA;VENTOLIN HFA) 108 (90 BASE) MCG/ACT inhaler Inhale 1-2 puffs into the lungs every 6 (six) hours as needed for wheezing. 06/18/12 06/18/13  Johnsie Kindred, NP  amoxicillin-clavulanate (AUGMENTIN) 875-125 MG tablet Take 1 tablet by mouth every 12 (twelve) hours. 04/07/18   Marjorie Deprey Lyn, MD  traMADol (ULTRAM) 50 MG tablet Take 1 tablet (50 mg total) by mouth every 6 (six) hours as needed for pain. 06/01/13   Emilia Beck, PA-C    Family History No family history on file.  Social History Social History   Tobacco Use  . Smoking status: Never Smoker  Substance Use Topics  . Alcohol use: No  . Drug use: No     Allergies   Patient has no known  allergies.   Review of Systems Review of Systems  Constitutional: Negative for activity change, fatigue and fever.  Respiratory: Negative for shortness of breath.   Cardiovascular: Negative for chest pain.  Gastrointestinal: Negative for abdominal pain.  All other systems reviewed and are negative.    Physical Exam Updated Vital Signs BP (!) 144/88   Pulse 84   Temp 98.2 F (36.8 C) (Oral)   Resp 18   Ht 5\' 6"  (1.676 m)   Wt 81.6 kg (180 lb)   SpO2 99%   BMI 29.05 kg/m   Physical Exam  Constitutional: He is oriented to person, place, and time. He appears well-nourished.  HENT:  Head: Normocephalic.  Eyes: Conjunctivae are normal.  Cardiovascular: Normal rate and regular rhythm.  Pulmonary/Chest: Effort normal and breath sounds normal. No respiratory distress.  Abdominal: Soft. He exhibits no distension. There is no tenderness.  Neurological: He is oriented to person, place, and time.  Skin: Skin is warm and dry. He is not diaphoretic.  Right thumb with swelling, puncture wound, erythema.  Spreading  Psychiatric: He has a normal mood and affect. His behavior is normal.         ED Treatments / Results  Labs (all labs ordered are listed, but only abnormal results are displayed) Labs Reviewed  COMPREHENSIVE METABOLIC PANEL - Abnormal; Notable for the following components:  Result Value   Glucose, Bld 111 (*)    All other components within normal limits  CBC WITH DIFFERENTIAL/PLATELET    EKG None  Radiology Dg Hand Complete Right  Result Date: 04/07/2018 CLINICAL DATA:  Pt states that he was bitten by a dog 3 days ago. Pt has swelling and lacerations on his thumb and a couple of smaller ones on the 3rd and 4th digit. EXAM: RIGHT HAND - COMPLETE 3+ VIEW COMPARISON:  None. FINDINGS: There is diffuse soft tissue swelling of all digits and the web space between first and 2nd metacarpals. There is no soft tissue gas. No acute fracture or radiopaque foreign  body. IMPRESSION: Soft tissue swelling. Electronically Signed   By: Norva PavlovElizabeth  Brown M.D.   On: 04/07/2018 15:51    Procedures Procedures (including critical care time)  Medications Ordered in ED Medications  Tdap (BOOSTRIX) injection 0.5 mL (0.5 mLs Intramuscular Given 04/07/18 1619)  sodium chloride 0.9 % bolus 500 mL (0 mLs Intravenous Stopped 04/07/18 1630)  rabies vaccine (RABAVERT) injection 1 mL (1 mL Intramuscular Given 04/07/18 1618)  rabies immune globulin (HYPERAB/KEDRAB) injection 1,650 Units (1,650 Units Infiltration Given 04/07/18 1620)  piperacillin-tazobactam (ZOSYN) IVPB 3.375 g (0 g Intravenous Stopped 04/07/18 1630)  amoxicillin-clavulanate (AUGMENTIN) 875-125 MG per tablet 1 tablet (1 tablet Oral Given 04/07/18 1700)     Initial Impression / Assessment and Plan / ED Course  I have reviewed the triage vital signs and the nursing notes.  Pertinent labs & imaging results that were available during my care of the patient were reviewed by me and considered in my medical decision making (see chart for details).    Patient is a 27 year old male presenting after animal bite.  Patient had a dog that was wild bite his right thumb around 36 hours ago.  Patient noted swelling, increased pain, and spreading redness.  Came here to be evaluated.  Patient not receive rabies in the past.  Is not up-to-date on tetanus.  Patient has unknown whereabouts of the dog.  No fever.  3:39 PM Does have significant swelling, mild erythema.  Patient's not trialed any oral antibiotics.  In-depth discussion had with patient about inpatient versus outpatient treatment.  Discussed with Dr. Janee Mornhompson, he feels that patient would should return to follow-up with primary care or the emergency department.  Discussed with patient, he will return with any increasing erythema, worsening pain, or change in symptoms.  He understands a low threshold for return.  With he and girlfriend at bedside expressed  understanding and seem that outpatient trial of antibiotics is reasonable first option.  Final Clinical Impressions(s) / ED Diagnoses   Final diagnoses:  Animal bite    ED Discharge Orders        Ordered    amoxicillin-clavulanate (AUGMENTIN) 875-125 MG tablet  Every 12 hours     04/07/18 1754       Youlanda Tomassetti, Cindee Saltourteney Lyn, MD 04/08/18 2321

## 2018-04-07 NOTE — ED Notes (Signed)
ED Provider at bedside. 

## 2018-04-07 NOTE — Discharge Instructions (Signed)
Please return immediately to the emergency department if you feel that the redness, pain, or infection seems to be spreading.  As we discussed we have low threshold to have you return to admit for IV antibiotics.  Please keep your hand elevated, do warm soapy water soaks.  And take your antibiotics as prescribed.                                   RABIES VACCINE FOLLOW UP  Patient's Name: Peter Meadows                     Original Order Date:04/07/2018  Medical Record Number: [id number]  ED Physician: Abelino DerrickMackuen, Gershom Brobeck Lyn, * Primary Diagnosis: Rabies Exposure       PCP: Patient, No Pcp Per  Patient Phone Number: (home) [phone] (home)    (cell)  Telephone Information:  Mobile [phone]    (work) There is no work phone number on file. Species of Animal: Dog   You have been seen in the Emergency Department for a possible rabies exposure. It's very important you return for the additional vaccine doses.  Please call the clinic listed below for hours of operation.   Clinic that will administer your rabies vaccines:    DAY 0:  04/07/2018      DAY 3:  04/10/2018       DAY 7:  04/14/2018     DAY 14:  04/21/2018         The 5th vaccine injection is considered for immune compromised patients only.  DAY 28:  05/05/2018

## 2018-04-07 NOTE — ED Triage Notes (Signed)
Pt states bite by a stray dog 3 days ago to rt thumb, swelling noted to rt hand and thumb

## 2018-04-10 ENCOUNTER — Encounter: Payer: Self-pay | Admitting: Emergency Medicine

## 2018-04-10 ENCOUNTER — Emergency Department (INDEPENDENT_AMBULATORY_CARE_PROVIDER_SITE_OTHER)
Admission: EM | Admit: 2018-04-10 | Discharge: 2018-04-10 | Disposition: A | Discharge status: Home / Self Care | Payer: BLUE CROSS/BLUE SHIELD | Source: Home / Self Care

## 2018-04-10 DIAGNOSIS — Z23 Encounter for immunization: Secondary | ICD-10-CM

## 2018-04-10 MED ORDER — RABIES VACCINE, PCEC IM SUSR
1.0000 mL | Freq: Once | INTRAMUSCULAR | Status: AC
  Administered 2018-04-10: 1 mL via INTRAMUSCULAR

## 2018-04-10 NOTE — ED Triage Notes (Signed)
Pt here for rabies vaccine. This is his first Injection since seen at hospital on 4/19. Pt had dog bite on right thumb. Reports improvement and tolerating abx. Vaccine giv en in left delt per pt request. Advised to return in 4 days.

## 2018-04-14 ENCOUNTER — Emergency Department (INDEPENDENT_AMBULATORY_CARE_PROVIDER_SITE_OTHER)
Admission: EM | Admit: 2018-04-14 | Discharge: 2018-04-14 | Disposition: A | Discharge status: Home / Self Care | Payer: BLUE CROSS/BLUE SHIELD | Source: Home / Self Care

## 2018-04-14 DIAGNOSIS — Z23 Encounter for immunization: Secondary | ICD-10-CM | POA: Diagnosis not present

## 2018-04-14 MED ORDER — RABIES VACCINE, PCEC IM SUSR
1.0000 mL | Freq: Once | INTRAMUSCULAR | Status: AC
  Administered 2018-04-14: 1 mL via INTRAMUSCULAR

## 2018-04-14 NOTE — ED Triage Notes (Signed)
Pt here for rabies vaccination.  No problem with 2 previous injections.  Given in the Right Deltoid without incident.

## 2018-04-21 ENCOUNTER — Emergency Department (INDEPENDENT_AMBULATORY_CARE_PROVIDER_SITE_OTHER)
Admission: EM | Admit: 2018-04-21 | Discharge: 2018-04-21 | Disposition: A | Discharge status: Home / Self Care | Payer: BLUE CROSS/BLUE SHIELD | Source: Home / Self Care

## 2018-04-21 ENCOUNTER — Encounter: Payer: Self-pay | Admitting: Emergency Medicine

## 2018-04-21 DIAGNOSIS — Z23 Encounter for immunization: Secondary | ICD-10-CM | POA: Diagnosis not present

## 2018-04-21 HISTORY — DX: Disorder of kidney and ureter, unspecified: N28.9

## 2018-04-21 MED ORDER — RABIES VACCINE, PCEC IM SUSR
1.0000 mL | Freq: Once | INTRAMUSCULAR | Status: AC
Start: 2018-04-21 — End: 2018-04-21
  Administered 2018-04-21: 1 mL via INTRAMUSCULAR

## 2018-04-21 NOTE — ED Triage Notes (Signed)
Here for final in series of rabies vaccinations. Has tolerated all well without complications.

## 2020-04-19 ENCOUNTER — Other Ambulatory Visit: Payer: Self-pay

## 2020-04-19 ENCOUNTER — Emergency Department (HOSPITAL_BASED_OUTPATIENT_CLINIC_OR_DEPARTMENT_OTHER)
Admission: EM | Admit: 2020-04-19 | Discharge: 2020-04-19 | Disposition: A | Discharge status: Home / Self Care | Payer: Self-pay | Attending: Emergency Medicine | Admitting: Emergency Medicine

## 2020-04-19 ENCOUNTER — Encounter (HOSPITAL_BASED_OUTPATIENT_CLINIC_OR_DEPARTMENT_OTHER): Payer: Self-pay | Admitting: Emergency Medicine

## 2020-04-19 ENCOUNTER — Emergency Department (HOSPITAL_COMMUNITY): Payer: Self-pay

## 2020-04-19 ENCOUNTER — Emergency Department (HOSPITAL_BASED_OUTPATIENT_CLINIC_OR_DEPARTMENT_OTHER): Payer: Self-pay

## 2020-04-19 DIAGNOSIS — N50811 Right testicular pain: Secondary | ICD-10-CM | POA: Insufficient documentation

## 2020-04-19 DIAGNOSIS — J45909 Unspecified asthma, uncomplicated: Secondary | ICD-10-CM | POA: Insufficient documentation

## 2020-04-19 DIAGNOSIS — N5082 Scrotal pain: Secondary | ICD-10-CM | POA: Insufficient documentation

## 2020-04-19 DIAGNOSIS — Z79899 Other long term (current) drug therapy: Secondary | ICD-10-CM | POA: Insufficient documentation

## 2020-04-19 DIAGNOSIS — R1031 Right lower quadrant pain: Secondary | ICD-10-CM

## 2020-04-19 LAB — CBC WITH DIFFERENTIAL/PLATELET
Abs Immature Granulocytes: 0.03 10*3/uL (ref 0.00–0.07)
Basophils Absolute: 0 10*3/uL (ref 0.0–0.1)
Basophils Relative: 0 %
Eosinophils Absolute: 0.2 10*3/uL (ref 0.0–0.5)
Eosinophils Relative: 2 %
HCT: 44.6 % (ref 39.0–52.0)
Hemoglobin: 15.4 g/dL (ref 13.0–17.0)
Immature Granulocytes: 0 %
Lymphocytes Relative: 33 %
Lymphs Abs: 2.8 10*3/uL (ref 0.7–4.0)
MCH: 29.3 pg (ref 26.0–34.0)
MCHC: 34.5 g/dL (ref 30.0–36.0)
MCV: 85 fL (ref 80.0–100.0)
Monocytes Absolute: 0.6 10*3/uL (ref 0.1–1.0)
Monocytes Relative: 7 %
Neutro Abs: 4.7 10*3/uL (ref 1.7–7.7)
Neutrophils Relative %: 58 %
Platelets: 231 10*3/uL (ref 150–400)
RBC: 5.25 MIL/uL (ref 4.22–5.81)
RDW: 13.3 % (ref 11.5–15.5)
WBC: 8.3 10*3/uL (ref 4.0–10.5)
nRBC: 0 % (ref 0.0–0.2)

## 2020-04-19 LAB — BASIC METABOLIC PANEL
Anion gap: 9 (ref 5–15)
BUN: 17 mg/dL (ref 6–20)
CO2: 26 mmol/L (ref 22–32)
Calcium: 9.8 mg/dL (ref 8.9–10.3)
Chloride: 102 mmol/L (ref 98–111)
Creatinine, Ser: 1.03 mg/dL (ref 0.61–1.24)
GFR calc Af Amer: >60 mL/min (ref >60)
GFR calc non Af Amer: >60 mL/min (ref >60)
Glucose, Bld: 99 mg/dL (ref 70–99)
Potassium: 3.9 mmol/L (ref 3.5–5.1)
Sodium: 137 mmol/L (ref 135–145)

## 2020-04-19 LAB — URINALYSIS, ROUTINE W REFLEX MICROSCOPIC
Bilirubin Urine: NEGATIVE
Glucose, UA: NEGATIVE mg/dL
Hgb urine dipstick: NEGATIVE
Ketones, ur: NEGATIVE mg/dL
Leukocytes,Ua: NEGATIVE
Nitrite: NEGATIVE
Protein, ur: NEGATIVE mg/dL
Specific Gravity, Urine: 1.02 (ref 1.005–1.030)
pH: 5.5 (ref 5.0–8.0)

## 2020-04-19 MED ORDER — CYCLOBENZAPRINE HCL 10 MG PO TABS: 10.0000 mg | ORAL_TABLET | Freq: Two times a day (BID) | ORAL | 0 refills | Status: DC | PRN

## 2020-04-19 MED ORDER — IBUPROFEN 800 MG PO TABS: 800.0000 mg | ORAL_TABLET | Freq: Three times a day (TID) | ORAL | 0 refills | Status: DC

## 2020-04-19 NOTE — ED Notes (Signed)
Pt requires U/s - phone report to the charge nurse at [facility] ED. The patient is to travel via [employer], IV intact per MD order  .

## 2020-04-19 NOTE — ED Provider Notes (Signed)
Patient transferred to [facility] for [place] of scrotum.   Originally presented with right flank/groin pain.  Labs and CT negative at [facility].    [place] negative at [facility].  States symptoms worsen with movement.  He questions muscle strain.  No evidence of torsion or other acute abdominal process.  Plan for discharge with PCP follow-up.   Roxy Horseman, PA-C 04/19/20 [id number]    Ward, Layla Maw, DO 04/19/20 [phone]

## 2020-04-19 NOTE — Discharge Instructions (Addendum)
Return for new or worsening symptoms

## 2020-04-19 NOTE — ED Provider Notes (Signed)
[facility] EMERGENCY DEPARTMENT Provider Note   CSN: [id number] Arrival date & time: 04/19/20  0133     History Chief Complaint  Patient presents with  . Groin Pain    Peter Meadows is a 29 y.o. male.  The history is provided by the patient.  Groin Pain This is a new problem. The current episode started 3 to 5 hours ago. The problem occurs constantly. The problem has been gradually improving. Pertinent negatives include no abdominal pain. Exacerbated by: Lifting. The symptoms are relieved by rest.   Patient reports sudden onset of right groin and testicle pain approximately 4 hours ago.  Patient reports he was picking up a dog leash.  Since that time is been having pain in his right flank, groin and testicle.  No trauma.  He is able to urinate without difficulty.  No penile discharge    No concern for STDs.  He has never had this before.  Patient has a history of left nephrectomy as an infant  Past Medical History:  Diagnosis Date  . Asthma   . Renal disorder    only one kidney; other removed as newborn    There are no problems to display for this patient.   Past Surgical History:  Procedure Laterality Date  . NEPHRECTOMY         No family history on file.  Social History   Tobacco Use  . Smoking status: Never Smoker  . Smokeless tobacco: Never Used  Substance Use Topics  . Alcohol use: No  . Drug use: No    Home Medications Prior to Admission medications   Medication Sig Start Date End Date Taking? Authorizing Provider  albuterol (PROVENTIL HFA;VENTOLIN HFA) 108 (90 BASE) MCG/ACT inhaler Inhale 2 puffs into the lungs every 6 (six) hours as needed.    [provider]    Allergies    Patient has no known allergies.  Review of Systems   Review of Systems  Constitutional: Negative for fever.  Gastrointestinal: Negative for abdominal pain, rectal pain and vomiting.  Genitourinary: Positive for flank pain and testicular pain. Negative  for difficulty urinating, discharge, dysuria and scrotal swelling.  All other systems reviewed and are negative.   Physical Exam Updated Vital Signs BP (!) 157/103 (BP Location: Right Arm)   Pulse (!) 115   Temp 98.8 F (37.1 C) (Oral)   Resp 20   Ht 1.651 m (5\' 5" )   Wt 90.7 kg   SpO2 99%   BMI 33.28 kg/m   Physical Exam CONSTITUTIONAL: Well developed/well nourished, anxious HEAD: Normocephalic/atraumatic EYES: EOMI/PERRL ENMT: Mucous membranes moist NECK: supple no meningeal signs SPINE/BACK:entire spine nontender CV: S1/S2 noted, no murmurs/rubs/gallops noted LUNGS: Lungs are clear to auscultation bilaterally, no apparent distress ABDOMEN: soft, nontender, no rebound or guarding, bowel sounds noted throughout abdomen GU: Right cva tenderness, he is uncircumcised, no penile discharge, no penile lesion  testicles descended bilaterally.+Cremasteric reflex No testicular tenderness noted.  No scrotal tenderness, no erythema or edema.  No hernia noted.  No inguinal lymphadenopathy.  Nurse chaperone present for exam NEURO: Pt is awake/alert/appropriate, moves all extremitiesx4.  No facial droop.  EXTREMITIES: pulses normal/equal, full ROM SKIN: warm, color normal PSYCH: Anxious  ED Results / Procedures / Treatments   Labs (all labs ordered are listed, but only abnormal results are displayed) Labs Reviewed  URINALYSIS, ROUTINE W REFLEX MICROSCOPIC  BASIC METABOLIC PANEL  CBC WITH DIFFERENTIAL/PLATELET  GC/CHLAMYDIA PROBE AMP ([place]) NOT AT [facility]  EKG None  Radiology CT Renal Stone Study  Result Date: 04/19/2020 CLINICAL DATA:  Right flank pain. EXAM: CT ABDOMEN AND PELVIS WITHOUT CONTRAST TECHNIQUE: Multidetector CT imaging of the abdomen and pelvis was performed following the standard protocol without IV contrast. COMPARISON:  None. FINDINGS: Lower chest: No acute abnormality. Hepatobiliary: No focal liver abnormality is seen. There is diffuse fatty infiltration  of the liver parenchyma. No gallstones, gallbladder wall thickening, or biliary dilatation. Pancreas: Unremarkable. No pancreatic ductal dilatation or surrounding inflammatory changes. Spleen: Normal in size without focal abnormality. Adrenals/Urinary Tract: Adrenal glands are unremarkable. The right kidney is normal, without renal calculi, focal lesion, or hydronephrosis. The left kidney is surgically absent. Bladder is unremarkable. Stomach/Bowel: Stomach is within normal limits. Appendix appears normal. No evidence of bowel wall thickening, distention, or inflammatory changes. Vascular/Lymphatic: No significant vascular findings are present. No enlarged abdominal or pelvic lymph nodes. Reproductive: Prostate is unremarkable. Other: No abdominal wall hernia or abnormality. No abdominopelvic ascites. Musculoskeletal: No acute or significant osseous findings. IMPRESSION: 1. No acute findings in the abdomen or pelvis. 2. Hepatic steatosis. 3. Status post left nephrectomy. Electronically Signed   By: Aram Candela M.D.   On: 04/19/2020 02:36    Procedures Procedures Medications Ordered in ED Medications - No data to display  ED Course  I have reviewed the triage vital signs and the nursing notes.  Pertinent labs/IMAGING results that were available during my care of the patient were reviewed by me and considered in my medical decision making (see chart for details).    MDM Rules/Calculators/A&P                      1:51 AM Patient reports at this time he is nearly pain-free.  On his exam, testicles are descended without focal tenderness.  Patient also reports flank pain.  Ureteral stone is a possibility.  Urinalysis pending. 2:10 AM Urinalysis is negative.  Patient reports pain will come and go.  On repeat exam, there is no testicular tenderness, testicles are descended bilaterally, +cremasteric reflex.  At this point his clinical exam is not indicative of testicular torsion He reports pain is  at times in his right flank and his inguinal region.  Stone disease is still a possibility.  We will proceed with CT imaging 2:50 AM CT scan is negative for acute process.  Patient appears comfortable, denying any testicle pain now.  Still has pain in his right inguinal region. Although his exam is unremarkable, torsion is still a possibility.  Patient will have emergent testicular ultrasound.  Ultrasound imaging is not available at this facility at this time of night.  Due to urgency of this situation, patient will be transferred to [facility].  To expedite care, patient's girlfriend will drive him to the hospital.  His imaging has already been ordered.  I spoken to Dr. Elesa Massed at [facility] who will follow up on ultrasound results, this was discussed with patient.    This patient presents to the ED for concern of groin pain, testicle pain, this involves an extensive number of treatment options, and is a complaint that carries with it a high risk of complications and morbidity.  The differential diagnosis includes testicular torsion, hernia, epididymitis, kidney stone   Lab Tests:   I Ordered, reviewed, and interpreted labs, which included urinalysis, electrolytes, blood count   Imaging Studies ordered:   I ordered imaging studies which included CT renal   I independently visualized  and interpreted imaging which showed acute findings  Additional history obtained:   Additional history obtained from a friend/significant other   Reevaluation:  After the interventions stated above, I reevaluated the patient and found patient is improved   Final Clinical Impression(s) / ED Diagnoses Final diagnoses:  Pain in right testicle  Right inguinal pain  Scrotal pain    Rx / DC Orders ED Discharge Orders    None       Ripley Fraise, MD 04/19/20 [phone]

## 2020-04-19 NOTE — ED Notes (Signed)
Pt instructed to change into gown. Provider aware of patient arrival.

## 2020-04-19 NOTE — ED Notes (Signed)
Patient ambulatory with steady gait to CT and back

## 2020-04-19 NOTE — ED Triage Notes (Signed)
Pt states he is having pain in his right groin and testicle area  Pt states the pain started about 2030  Denies swelling

## 2020-04-21 LAB — GC/CHLAMYDIA PROBE AMP (~~LOC~~) NOT AT ARMC
Chlamydia: NEGATIVE
Comment: NEGATIVE
Comment: NORMAL
Neisseria Gonorrhea: NEGATIVE

## 2020-12-24 ENCOUNTER — Other Ambulatory Visit: Payer: Self-pay

## 2020-12-24 ENCOUNTER — Emergency Department (HOSPITAL_BASED_OUTPATIENT_CLINIC_OR_DEPARTMENT_OTHER): Payer: HRSA Program

## 2020-12-24 ENCOUNTER — Emergency Department (HOSPITAL_BASED_OUTPATIENT_CLINIC_OR_DEPARTMENT_OTHER)
Admission: EM | Admit: 2020-12-24 | Discharge: 2020-12-24 | Disposition: A | Discharge status: Home / Self Care | Payer: HRSA Program | Attending: Emergency Medicine | Admitting: Emergency Medicine

## 2020-12-24 ENCOUNTER — Encounter (HOSPITAL_BASED_OUTPATIENT_CLINIC_OR_DEPARTMENT_OTHER): Payer: Self-pay

## 2020-12-24 DIAGNOSIS — U071 COVID-19: Secondary | ICD-10-CM | POA: Diagnosis not present

## 2020-12-24 DIAGNOSIS — R0602 Shortness of breath: Secondary | ICD-10-CM

## 2020-12-24 DIAGNOSIS — J45909 Unspecified asthma, uncomplicated: Secondary | ICD-10-CM | POA: Insufficient documentation

## 2020-12-24 DIAGNOSIS — R059 Cough, unspecified: Secondary | ICD-10-CM | POA: Diagnosis present

## 2020-12-24 MED ORDER — ALBUTEROL SULFATE HFA 108 (90 BASE) MCG/ACT IN AERS
2.0000 | INHALATION_SPRAY | Freq: Once | RESPIRATORY_TRACT | Status: AC
  Administered 2020-12-24: 2 via RESPIRATORY_TRACT
  Filled 2020-12-24: qty 6.7

## 2020-12-24 MED ORDER — BENZONATATE 100 MG PO CAPS: 100.0000 mg | ORAL_CAPSULE | Freq: Three times a day (TID) | ORAL | 0 refills | Status: DC

## 2020-12-24 NOTE — ED Provider Notes (Signed)
[facility]PT [facility] Emergency Department Provider Note MRN:  [id number]  Arrival date & time: 12/24/20     Chief Complaint   Cough   History of Present Illness   Peter Meadows is a 30 y.o. year-old male with a history of asthma presenting to the ED with chief complaint of cough.  Cold/flulike symptoms for the past 10 days, has tested positive for COVID-19.  Stopped having fevers but then started having persistent coughing spells over the past few days, which worried him.  Denies chest pain or shortness of breath, no abdominal pain, no leg pain or swelling, no other complaints.  Review of Systems  A complete 10 system review of systems was obtained and all systems are negative except as noted in the HPI and PMH.   Patient's Health History    Past Medical History:  Diagnosis Date  . Asthma   . Renal disorder    only one kidney; other removed as newborn    Past Surgical History:  Procedure Laterality Date  . NEPHRECTOMY      History reviewed. No pertinent family history.  Social History   Socioeconomic History  . Marital status: Single    Spouse name: Not on file  . Number of children: Not on file  . Years of education: Not on file  . Highest education level: Not on file  Occupational History  . Not on file  Tobacco Use  . Smoking status: Never Smoker  . Smokeless tobacco: Never Used  Vaping Use  . Vaping Use: Never used  Substance and Sexual Activity  . Alcohol use: No  . Drug use: No  . Sexual activity: Not on file  Other Topics Concern  . Not on file  Social History Narrative  . Not on file   Social Determinants of Health   Financial Resource Strain: Not on file  Food Insecurity: Not on file  Transportation Needs: Not on file  Physical Activity: Not on file  Stress: Not on file  Social Connections: Not on file  Intimate Partner Violence: Not on file     Physical Exam   Vitals:   12/24/20 0250  BP: (!) 176/124  Pulse: (!)  115  Resp: 18  Temp: 98.8 F (37.1 C)  SpO2: 98%    CONSTITUTIONAL: Well-appearing, NAD NEURO:  Alert and oriented x 3, no focal deficits EYES:  eyes equal and reactive ENT/NECK:  no LAD, no JVD CARDIO: Regular rate, well-perfused, normal S1 and S2 PULM:  CTAB no wheezing or rhonchi GI/GU:  normal bowel sounds, non-distended, non-tender MSK/SPINE:  No gross deformities, no edema SKIN:  no rash, atraumatic PSYCH:  Appropriate speech and behavior  *Additional and/or pertinent findings included in MDM below  Diagnostic and Interventional Summary    EKG Interpretation  Date/Time:    Ventricular Rate:    PR Interval:    QRS Duration:   QT Interval:    QTC Calculation:   R Axis:     Text Interpretation:        Labs Reviewed - No data to display  DG Chest Port 1 View  Final Result      Medications  albuterol (VENTOLIN HFA) 108 (90 Base) MCG/ACT inhaler 2 puff (has no administration in time range)     Procedures  /  Critical Care Procedures  ED Course and Medical Decision Making  I have reviewed the triage vital signs, the nursing notes, and pertinent available records from the EMR.  Listed above  are laboratory and imaging tests that I personally ordered, reviewed, and interpreted and then considered in my medical decision making (see below for details).  Well-appearing, no increased work of breathing, known COVID-19.  X-ray is reassuring, mostly here for symptomatic control.  Providing antitussives.  Appropriate for discharge.       Elmer Sow. Pilar Plate, MD [facility] Emergency Medicine [facility] [email or web address]  Final Clinical Impressions(s) / ED Diagnoses     ICD-10-CM   1. COVID-19  U07.1   2. Shortness of breath  R06.02 DG Chest Port 1 View    DG Chest [place] 1 View    ED Discharge Orders         Ordered    benzonatate (TESSALON) 100 MG capsule  Every 8 hours        12/24/20 0409           Discharge Instructions Discussed with  and Provided to Patient:     Discharge Instructions     You were evaluated in the Emergency Department and after careful evaluation, we did not find any emergent condition requiring admission or further testing in the hospital.  Your exam/testing today is overall reassuring.  Use the inhaler and/or the Tessalon medicine for cough.  Please return to the Emergency Department if you experience any worsening of your condition.   Thank you for allowing [place] to be a part of your care.      Sabas Sous, MD 12/24/20 [phone]

## 2020-12-24 NOTE — Discharge Instructions (Addendum)
You were evaluated in the Emergency Department and after careful evaluation, we did not find any emergent condition requiring admission or further testing in the hospital.  Your exam/testing today is overall reassuring.  Use the inhaler and/or the Tessalon medicine for cough.  Please return to the Emergency Department if you experience any worsening of your condition.   Thank you for allowing [place] to be a part of your care.

## 2020-12-24 NOTE — ED Triage Notes (Signed)
Tested positive for COVID 12/28. Symptoms started 12/26. Reports productive cough and is concerned for pneumonia or bronchitis.

## 2023-09-15 ENCOUNTER — Ambulatory Visit: Payer: Self-pay | Admitting: Primary Care

## 2024-02-13 ENCOUNTER — Other Ambulatory Visit: Payer: Self-pay | Admitting: Family Medicine

## 2024-02-13 ENCOUNTER — Encounter: Payer: Self-pay | Admitting: Family Medicine

## 2024-02-13 ENCOUNTER — Ambulatory Visit (INDEPENDENT_AMBULATORY_CARE_PROVIDER_SITE_OTHER): Payer: No Typology Code available for payment source | Admitting: Family Medicine

## 2024-02-13 VITALS — BP 129/87 | HR 83 | Ht 66.5 in | Wt 186.0 lb

## 2024-02-13 DIAGNOSIS — R053 Chronic cough: Secondary | ICD-10-CM | POA: Diagnosis not present

## 2024-02-13 DIAGNOSIS — J3081 Allergic rhinitis due to animal (cat) (dog) hair and dander: Secondary | ICD-10-CM | POA: Diagnosis not present

## 2024-02-13 DIAGNOSIS — J4541 Moderate persistent asthma with (acute) exacerbation: Secondary | ICD-10-CM

## 2024-02-13 DIAGNOSIS — K219 Gastro-esophageal reflux disease without esophagitis: Secondary | ICD-10-CM

## 2024-02-13 MED ORDER — PREDNISONE 20 MG PO TABS: ORAL_TABLET | ORAL | 0 refills | Status: DC

## 2024-02-13 MED ORDER — OMEPRAZOLE 40 MG PO CPDR: 40.0000 mg | DELAYED_RELEASE_CAPSULE | Freq: Every day | ORAL | 3 refills | Status: DC

## 2024-02-13 MED ORDER — FLUTICASONE PROPIONATE 50 MCG/ACT NA SUSP: 2.0000 | Freq: Every day | NASAL | 6 refills | Status: DC

## 2024-02-13 MED ORDER — FLUTICASONE-SALMETEROL 500-50 MCG/ACT IN AEPB: 1.0000 | INHALATION_SPRAY | Freq: Two times a day (BID) | RESPIRATORY_TRACT | 6 refills | Status: DC

## 2024-02-13 NOTE — Telephone Encounter (Signed)
   Please assist with PA completion.

## 2024-02-13 NOTE — Progress Notes (Signed)
 Office Note 02/13/2024  CC: Estab care, pulm concern HPI:  Peter Meadows is a 33 y.o. male who is here accompanied by his fiance to establish care and discuss cough. Patient's most recent primary MD: none Old records in epic/[facility] EMR were reviewed prior to or during today's visit.  Was told in the very remote past that he had asthma.  However, did not have problems for quite a while in his 61s.  Then, about 6 to 8 months ago he started developing of a persistent dry and nonproductive cough. He does hear some wheezing.  Denies shortness of breath or dyspnea on exertion.  No fevers, hemoptysis, or abnormal weight loss.  He does feel like he has something in the back of his throat all the time, clears his throat a lot, voice goes in and out, has done this ever since his cough became a problem. Denies heartburn/reflux symptoms.  No sore throat. Denies facial/sinus pain. He does describe chronic nasal congestion with postnasal dri.  He does not take any nasal spray but does take Singulair 10 mg a day and Xyzal 5 mg a day. Also, his fiance has been letting him use her Wixela--which she says might be expired--for the last 3 to 4 weeks fairly regularly.  He does feel like this has been somewhat helpful.  He has albuterol to use as needed.   Past Medical History:  Diagnosis Date   Asthma    Hepatic steatosis    noted on CT 2021   History of left nephrectomy    infancy    Past Surgical History:  Procedure Laterality Date   NEPHRECTOMY  2013   WISDOM TOOTH EXTRACTION  2018    Family History  Problem Relation Age of Onset   COPD Father    Asthma Father    Depression Father    Drug abuse Father    Alcohol abuse Paternal Grandfather    Cancer Paternal Grandfather    Hearing loss Paternal Grandfather     Social History   Socioeconomic History   Marital status: Single    Spouse name: Not on file   Number of children: Not on file   Years of education: Not on file    Highest education level: Not on file  Occupational History   Not on file  Tobacco Use   Smoking status: Never   Smokeless tobacco: Never  Vaping Use   Vaping status: Never Used  Substance and Sexual Activity   Alcohol use: No   Drug use: No   Sexual activity: Yes    Partners: Female  Other Topics Concern   Not on file  Social History Narrative   Not on file   Social Drivers of Health   Financial Resource Strain: Not on file  Food Insecurity: Not on file  Transportation Needs: Not on file  Physical Activity: Not on file  Stress: Not on file  Social Connections: Unknown (05/02/2022)   Received from [facility]   Social Network    Social Network: Not on file  Intimate Partner Violence: Unknown (03/25/2022)   Received from [facility]   HITS    Physically Hurt: Not on file    Insult or Talk Down To: Not on file    Threaten Physical Harm: Not on file    Scream or Curse: Not on file    Outpatient Encounter Medications as of 02/13/2024  Medication Sig   fluticasone (FLONASE) 50 MCG/ACT nasal spray Place 2 sprays into both  nostrils daily.   fluticasone-salmeterol (ADVAIR) 500-50 MCG/ACT AEPB Inhale 1 puff into the lungs in the morning and at bedtime.   levocetirizine (XYZAL ALLERGY 24HR) 5 MG tablet Take 5 mg by mouth daily.   montelukast (SINGULAIR) 10 MG tablet Take 10 mg by mouth daily.   omeprazole (PRILOSEC) 40 MG capsule Take 1 capsule (40 mg total) by mouth daily.   predniSONE (DELTASONE) 20 MG tablet 2 tabs every day x 5d then 1 tab every day x 5d   [DISCONTINUED] albuterol (PROVENTIL HFA;VENTOLIN HFA) 108 (90 BASE) MCG/ACT inhaler Inhale 2 puffs into the lungs every 6 (six) hours as needed. (Patient not taking: Reported on 02/13/2024)   [DISCONTINUED] benzonatate (TESSALON) 100 MG capsule Take 1 capsule (100 mg total) by mouth every 8 (eight) hours. (Patient not taking: Reported on 02/13/2024)   [DISCONTINUED] cyclobenzaprine (FLEXERIL) 10 MG tablet Take 1 tablet (10 mg  total) by mouth 2 (two) times daily as needed for muscle spasms. (Patient not taking: Reported on 02/13/2024)   [DISCONTINUED] ibuprofen (ADVIL) 800 MG tablet Take 1 tablet (800 mg total) by mouth 3 (three) times daily. (Patient not taking: Reported on 02/13/2024)   No facility-administered encounter medications on file as of 02/13/2024.    No Known Allergies  Review of Systems  Constitutional:  Negative for appetite change, chills, fatigue and fever.  HENT:  Positive for postnasal drip and voice change. Negative for congestion, dental problem, ear pain and sore throat.   Eyes:  Negative for discharge, redness and visual disturbance.  Respiratory:  Positive for cough and wheezing. Negative for chest tightness and shortness of breath.   Cardiovascular:  Negative for chest pain, palpitations and leg swelling.  Gastrointestinal:  Negative for abdominal pain, blood in stool, diarrhea, nausea and vomiting.  Genitourinary:  Negative for difficulty urinating, dysuria, flank pain, frequency, hematuria and urgency.  Musculoskeletal:  Negative for arthralgias, back pain, joint swelling, myalgias and neck stiffness.  Skin:  Negative for pallor and rash.  Neurological:  Negative for dizziness, speech difficulty, weakness and headaches.  Hematological:  Negative for adenopathy. Does not bruise/bleed easily.  Psychiatric/Behavioral:  Negative for confusion and sleep disturbance. The patient is not nervous/anxious.     PE; Blood pressure 129/87, pulse 83, height 5' 6.5" (1.689 m), weight 186 lb (84.4 kg), SpO2 96%. Body mass index is 29.57 kg/m.  Physical Exam  Gen: Alert, well appearing. Coughing frequently during exam.  Patient is oriented to person, place, time, and situation. AFFECT: pleasant, lucid thought and speech. ENT: Ears: EACs clear, normal epithelium.  TMs with good light reflex and landmarks bilaterally.  Eyes: no injection, icteris, swelling, or exudate.  EOMI, PERRLA. Nose: no drainage  or turbinate edema/swelling.  No injection or focal lesion.  Mouth: lips without lesion/swelling.  Oral mucosa pink and moist.  Dentition intact and without obvious caries or gingival swelling.  Oropharynx without erythema, exudate, or swelling.  Neck: supple/nontender.  No LAD, mass, or TM.  Carotid pulses 2+ bilaterally, without bruits. CV: RRR, no m/r/g.   LUNGS: CTA bilat, nonlabored resps, good aeration in all lung fields. ABD: soft, NT, ND, BS normal.  No hepatospenomegaly or mass.  No bruits. EXT: no clubbing, cyanosis, or edema.  Musculoskeletal: no joint swelling, erythema, warmth, or tenderness.  ROM of all joints intact. Skin - no sores or suspicious lesions or rashes or color changes  Pertinent labs:  Last CBC Lab Results  Component Value Date   WBC 8.3 04/19/2020   HGB 15.4 04/19/2020  HCT 44.6 04/19/2020   MCV 85.0 04/19/2020   MCH 29.3 04/19/2020   RDW 13.3 04/19/2020   PLT 231 04/19/2020   Last metabolic panel Lab Results  Component Value Date   GLUCOSE 99 04/19/2020   NA 137 04/19/2020   K 3.9 04/19/2020   CL 102 04/19/2020   CO2 26 04/19/2020   BUN 17 04/19/2020   CREATININE 1.03 04/19/2020   GFRNONAA >60 04/19/2020   CALCIUM 9.8 04/19/2020   PROT 7.6 04/07/2018   ALBUMIN 4.5 04/07/2018   BILITOT 1.2 04/07/2018   ALKPHOS 76 04/07/2018   AST 41 04/07/2018   ALT 60 04/07/2018   ANIONGAP 9 04/19/2020   ASSESSMENT AND PLAN:   New patient, establishing care.  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 Prednisone 40 mg a day x 5 days then 20 mg a day x 5 days.  Advair 500/50, 1 puff twice daily and likely taper down over the next few months. Refer to allergy and asthma clinic for expert evaluation and management (? Cough d/t asthma vs LPR?).  #2 laryngopharyngeal reflux.  I suspect silent reflux is occurring quite a bit with all the coughing he is having.  Start omeprazole 40 mg a day and this will likely be short-term until his asthma is  well-controlled.  2.  Allergic rhinitis.  Cats are a big issue.  They have rescue cats and there is really no option to get rid of them.  Start Flonase 2 sprays each nostril once a day.  Continue Singulair 10 mg a day and Xyzal 5 mg a day. Allergy and asthma referral ordered today.  An After Visit Summary was printed and given to the patient.  Return in about 2 weeks (around 02/27/2024) for f/u cough/asthma.  Signed:  Santiago Bumpers, MD           02/13/2024

## 2024-02-20 NOTE — Progress Notes (Deleted)
 New Patient Note  RE: Peter Meadows MRN: [id number] DOB: 02/07/1991 Date of Office Visit: 02/21/2024  Consult requested by: Jeoffrey Massed, MD Primary care provider: Jeoffrey Massed, MD  Chief Complaint: No chief complaint on file.  History of Present Illness: I had the pleasure of seeing Peter Meadows for initial evaluation at the [facility] on 02/20/2024. He is a 33 y.o. male, who is referred here by McGowen, Maryjean Morn, MD for the evaluation of coughing.  Discussed the use of AI scribe software for clinical note transcription with the patient, who gave verbal consent to proceed.  History of Present Illness             He reports symptoms of *** chest tightness, shortness of breath, coughing, wheezing, nocturnal awakenings for *** years. Current medications include *** which help. He reports *** using aerochamber with inhalers. He tried the following inhalers: ***. Main triggers are ***allergies, infections, weather changes, smoke, exercise, pet exposure. In the last month, frequency of symptoms: ***x/week. Frequency of nocturnal symptoms: ***x/month. Frequency of SABA use: ***x/week. Interference with physical activity: ***. Sleep is ***disturbed. In the last 12 months, emergency room visits/urgent care visits/doctor office visits or hospitalizations due to respiratory issues: ***. In the last 12 months, oral steroids courses: ***. Lifetime history of hospitalization for respiratory issues: ***. Prior intubations: ***. Asthma was diagnosed at age *** by ***. History of pneumonia: ***. He was evaluated by allergist ***pulmonologist in the past. Smoking exposure: ***. Up to date with flu vaccine: ***. Up to date with pneumonia vaccine: ***. Up to date with COVID-19 vaccine: ***. Prior Covid-19 infection: ***. History of reflux: ***.  02/13/2024 PCP visit: "Was told in the very remote past that he had asthma.  However, did not have problems for quite a while in his  63s.  Then, about 6 to 8 months ago he started developing of a persistent dry and nonproductive cough. He does hear some wheezing.  Denies shortness of breath or dyspnea on exertion.  No fevers, hemoptysis, or abnormal weight loss.   He does feel like he has something in the back of his throat all the time, clears his throat a lot, voice goes in and out, has done this ever since his cough became a problem. Denies heartburn/reflux symptoms.  No sore throat. Denies facial/sinus pain. He does describe chronic nasal congestion with postnasal dri.  He does not take any nasal spray but does take Singulair 10 mg a day and Xyzal 5 mg a day. Also, his fiance has been letting him use her Wixela--which she says might be expired--for the last 3 to 4 weeks fairly regularly.  He does feel like this has been somewhat helpful.  He has albuterol to use as needed.  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 Prednisone 40 mg a day x 5 days then 20 mg a day x 5 days.  Advair 500/50, 1 puff twice daily and likely taper down over the next few months. Refer to allergy and asthma clinic for expert evaluation and management (? Cough d/t asthma vs LPR?).   #2 laryngopharyngeal reflux.  I suspect silent reflux is occurring quite a bit with all the coughing he is having.  Start omeprazole 40 mg a day and this will likely be short-term until his asthma is well-controlled.   2.  Allergic rhinitis.  Cats are a big issue.  They have rescue cats and there is really no option to  get rid of them.  Start Flonase 2 sprays each nostril once a day.  Continue Singulair 10 mg a day and Xyzal 5 mg a day. Allergy and asthma referral ordered today."  Assessment and Plan: [name] is a 33 y.o. male with: ***  Assessment and Plan               No follow-ups on file.  No orders of the defined types were placed in this encounter.  Lab Orders  No laboratory test(s) ordered today    Other allergy screening: Asthma: {Blank  single:19197::"yes","no"} Rhino conjunctivitis: {Blank single:19197::"yes","no"} Food allergy: {Blank single:19197::"yes","no"} Medication allergy: {Blank single:19197::"yes","no"} Hymenoptera allergy: {Blank single:19197::"yes","no"} Urticaria: {Blank single:19197::"yes","no"} Eczema:{Blank single:19197::"yes","no"} History of recurrent infections suggestive of immunodeficency: {Blank single:19197::"yes","no"}  Diagnostics: Spirometry:  Tracings reviewed. His effort: {Blank single:19197::"Good reproducible efforts.","It was hard to get consistent efforts and there is a question as to whether this reflects a maximal maneuver.","Poor effort, data can not be interpreted."} FVC: ***L FEV1: ***L, ***% predicted FEV1/FVC ratio: ***% Interpretation: {Blank single:19197::"Spirometry consistent with mild obstructive disease","Spirometry consistent with moderate obstructive disease","Spirometry consistent with severe obstructive disease","Spirometry consistent with possible restrictive disease","Spirometry consistent with mixed obstructive and restrictive disease","Spirometry uninterpretable due to technique","Spirometry consistent with normal pattern","No overt abnormalities noted given today's efforts"}.  Please see scanned spirometry results for details.  Skin Testing: {Blank single:19197::"Select foods","Environmental allergy panel","Environmental allergy panel and select foods","Food allergy panel","None","Deferred due to recent antihistamines use"}. *** Results discussed with patient/family.   Past Medical History: There are no active problems to display for this patient.  Past Medical History:  Diagnosis Date  . Asthma   . Hepatic steatosis    noted on CT 2021  . History of left nephrectomy    infancy   Past Surgical History: Past Surgical History:  Procedure Laterality Date  . NEPHRECTOMY  2013  . WISDOM TOOTH EXTRACTION  2018   Medication List:  Current Outpatient Medications   Medication Sig Dispense Refill  . fluticasone (FLONASE) 50 MCG/ACT nasal spray Place 2 sprays into both nostrils daily. 16 g 6  . fluticasone-salmeterol (ADVAIR) 500-50 MCG/ACT AEPB Inhale 1 puff into the lungs in the morning and at bedtime. 1 each 6  . levocetirizine (XYZAL ALLERGY 24HR) 5 MG tablet Take 5 mg by mouth daily.    . montelukast (SINGULAIR) 10 MG tablet Take 10 mg by mouth daily.    Marland Kitchen omeprazole (PRILOSEC) 40 MG capsule Take 1 capsule (40 mg total) by mouth daily. 30 capsule 3  . predniSONE (DELTASONE) 20 MG tablet 2 tabs every day x 5d then 1 tab every day x 5d 15 tablet 0   No current facility-administered medications for this visit.   Allergies: No Known Allergies Social History: Social History   Socioeconomic History  . Marital status: Single    Spouse name: Not on file  . Number of children: Not on file  . Years of education: Not on file  . Highest education level: Not on file  Occupational History  . Not on file  Tobacco Use  . Smoking status: Never  . Smokeless tobacco: Never  Vaping Use  . Vaping status: Never Used  Substance and Sexual Activity  . Alcohol use: No  . Drug use: No  . Sexual activity: Yes    Partners: Female  Other Topics Concern  . Not on file  Social History Narrative   Lives in [place].   Company secretary.   Engaged as of 2025.   No children.  No tobacco, alcohol, or drugs.   Has many cats.   Social Drivers of Corporate investment banker Strain: Not on file  Food Insecurity: Not on file  Transportation Needs: Not on file  Physical Activity: Not on file  Stress: Not on file  Social Connections: Unknown (05/02/2022)   Received from [facility]   Social Network   . Social Network: Not on file   Lives in a ***. Smoking: *** Occupation: ***  Environmental HistorySurveyor, minerals in the house: Copywriter, advertising in the family room: {Blank single:19197::"yes","no"} Carpet in the bedroom:  {Blank single:19197::"yes","no"} Heating: {Blank single:19197::"electric","gas","heat pump"} Cooling: {Blank single:19197::"central","window","heat pump"} Pet: {Blank single:19197::"yes ***","no"}  Family History: Family History  Problem Relation Age of Onset  . COPD Father   . Asthma Father   . Depression Father   . Drug abuse Father   . Alcohol abuse Paternal Grandfather   . Cancer Paternal Grandfather   . Hearing loss Paternal Grandfather    Problem                               Relation Asthma                                   *** Eczema                                *** Food allergy                          *** Allergic rhino conjunctivitis     ***  Review of Systems  Constitutional:  Negative for appetite change, chills, fever and unexpected weight change.  HENT:  Negative for congestion and rhinorrhea.   Eyes:  Negative for itching.  Respiratory:  Negative for cough, chest tightness, shortness of breath and wheezing.   Cardiovascular:  Negative for chest pain.  Gastrointestinal:  Negative for abdominal pain.  Genitourinary:  Negative for difficulty urinating.  Skin:  Negative for rash.  Neurological:  Negative for headaches.   Objective: There were no vitals taken for this visit. There is no height or weight on file to calculate BMI. Physical Exam Vitals and nursing note reviewed.  Constitutional:      Appearance: Normal appearance. He is well-developed.  HENT:     Head: Normocephalic and atraumatic.     Right Ear: Tympanic membrane and external ear normal.     Left Ear: Tympanic membrane and external ear normal.     Nose: Nose normal.     Mouth/Throat:     Mouth: Mucous membranes are moist.     Pharynx: Oropharynx is clear.  Eyes:     Conjunctiva/sclera: Conjunctivae normal.  Cardiovascular:     Rate and Rhythm: Normal rate and regular rhythm.     Heart sounds: Normal heart sounds. No murmur heard.    No friction rub. No gallop.  Pulmonary:     Effort:  Pulmonary effort is normal.     Breath sounds: Normal breath sounds. No wheezing, rhonchi or rales.  Musculoskeletal:     Cervical back: Neck supple.  Skin:    General: Skin is warm.     Findings: No rash.  Neurological:     Mental Status: He is alert and oriented to  person, place, and time.  Psychiatric:        Behavior: Behavior normal.  The plan was reviewed with the patient/family, and all questions/concerned were addressed.  It was my pleasure to see Peter Meadows today and participate in his care. Please feel free to contact me with any questions or concerns.  Sincerely,  Wyline Mood, DO Allergy & Immunology  [facility] office: [phone] [facility] office: [phone]

## 2024-02-21 ENCOUNTER — Ambulatory Visit: Payer: Self-pay | Admitting: Allergy

## 2024-02-29 ENCOUNTER — Other Ambulatory Visit (HOSPITAL_COMMUNITY): Payer: Self-pay

## 2024-02-29 ENCOUNTER — Telehealth: Payer: Self-pay

## 2024-02-29 NOTE — Telephone Encounter (Signed)
 Pharmacy Patient Advocate Encounter  Received notification from [facility] that Prior Authorization for Wixela Inhub 500-50 has been DENIED.  No reason given; No denial letter received via Fax or CMM. It has been requested and will be uploaded to the media tab once received.   PA #/Case ID/Reference #: [id number]

## 2024-02-29 NOTE — Telephone Encounter (Signed)
 Pharmacy Patient Advocate Encounter   Received notification from Patient Pharmacy that prior authorization for Wixela Inhub 500-50 MCG/ACT is required/requested.   Insurance verification completed.   The patient is insured through [facility] ADVANTAGE/RX ADVANCE .   Per test claim: PA required; PA submitted to above mentioned insurance via CoverMyMeds Key/confirmation #/EOC [id number] Status is pending

## 2024-02-29 NOTE — Telephone Encounter (Signed)
 noted

## 2024-03-01 NOTE — Telephone Encounter (Signed)
 Pt must try Fluticasone-Salmeterol first

## 2024-03-01 NOTE — Telephone Encounter (Signed)
 Any formulary alternatives?  If I don't have this info then I can't help!

## 2024-03-05 ENCOUNTER — Encounter: Payer: Self-pay | Admitting: Family Medicine

## 2024-03-05 ENCOUNTER — Ambulatory Visit: Payer: No Typology Code available for payment source | Admitting: Family Medicine

## 2024-03-05 ENCOUNTER — Other Ambulatory Visit: Payer: Self-pay | Admitting: Family Medicine

## 2024-03-05 VITALS — BP 132/95 | HR 80 | Temp 99.1°F | Ht 66.5 in | Wt 187.6 lb

## 2024-03-05 DIAGNOSIS — J45991 Cough variant asthma: Secondary | ICD-10-CM

## 2024-03-05 DIAGNOSIS — J3081 Allergic rhinitis due to animal (cat) (dog) hair and dander: Secondary | ICD-10-CM | POA: Diagnosis not present

## 2024-03-05 MED ORDER — FLUTICASONE-SALMETEROL 250-50 MCG/ACT IN AEPB: 1.0000 | INHALATION_SPRAY | Freq: Two times a day (BID) | RESPIRATORY_TRACT | 2 refills | Status: DC

## 2024-03-05 NOTE — Progress Notes (Signed)
 OFFICE VISIT  03/05/2024  CC:  Chief Complaint  Patient presents with   Medical Management of Chronic Issues    Pt states the cough and asthma is improved, he was contacted to schedule with [facility] but declined at this time.    Patient is a 33 y.o. male who presents for 3-week follow-up cough and allergic rhinitis. 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 Prednisone 40 mg a day x 5 days then 20 mg a day x 5 days.  Advair 500/50, 1 puff twice daily and likely taper down over the next few months. Refer to allergy and asthma clinic for expert evaluation and management (? Cough d/t asthma vs LPR?).   #2 laryngopharyngeal reflux.  I suspect silent reflux is occurring quite a bit with all the coughing he is having.  Start omeprazole 40 mg a day and this will likely be short-term until his asthma is well-controlled.   2.  Allergic rhinitis.  Cats are a big issue.  They have rescue cats and there is really no option to get rid of them.  Start Flonase 2 sprays each nostril once a day.  Continue Singulair 10 mg a day and Xyzal 5 mg a day. Allergy and asthma referral ordered today."  INTERIM HX: Rhinitis significantly improved on Flonase. Dry cough significantly improved as well. No wheezing or shortness of breath.  Past Medical History:  Diagnosis Date   Asthma    Hepatic steatosis    noted on CT 2021   History of left nephrectomy    infancy    Past Surgical History:  Procedure Laterality Date   NEPHRECTOMY  2013   WISDOM TOOTH EXTRACTION  2018    Outpatient Medications Prior to Visit  Medication Sig Dispense Refill   fluticasone (FLONASE) 50 MCG/ACT nasal spray Place 2 sprays into both nostrils daily. 16 g 6   levocetirizine (XYZAL ALLERGY 24HR) 5 MG tablet Take 5 mg by mouth daily.     montelukast (SINGULAIR) 10 MG tablet Take 10 mg by mouth daily.     omeprazole (PRILOSEC) 40 MG capsule Take 1 capsule (40 mg total) by mouth daily. 30 capsule 3    fluticasone-salmeterol (ADVAIR) 500-50 MCG/ACT AEPB Inhale 1 puff into the lungs in the morning and at bedtime. 1 each 6   predniSONE (DELTASONE) 20 MG tablet 2 tabs every day x 5d then 1 tab every day x 5d 15 tablet 0   No facility-administered medications prior to visit.    No Known Allergies  Review of Systems As per HPI  PE:    03/05/2024    1:49 PM 02/13/2024   10:53 AM 12/24/2020    4:27 AM  Vitals with BMI  Height 5' 6.5" 5' 6.5"   Weight 187 lbs 10 oz 186 lbs   BMI 29.83 29.57   Systolic 132 129 409  Diastolic 95 87 92  Pulse 80 83 113     Physical Exam  Gen: Alert, well appearing.  Patient is oriented to person, place, time, and situation. AFFECT: pleasant, lucid thought and speech. CV: RRR, no m/r/g.   LUNGS: CTA bilat, nonlabored resps, good aeration in all lung fields.   LABS:  none  IMPRESSION AND PLAN:  #1 cough variant asthma. Improved significantly. Continue Advair 500/50, 1 puff twice daily for at least the next month.  Then stepdown to 250/51 puff twice daily. Discontinue omeprazole and if he gets pretty quick return of dry cough he  will restart this.  2.  Allergic rhinitis, improved on Flonase. He will continue this as well as Xyzal 5 mg a day and Singulair 10 mg a day. Encouraged the use of saline nasal spray daily as well.  He wants to go ahead and hold off on allergist referral at this time.  An After Visit Summary was printed and given to the patient.  FOLLOW UP: Return in about 3 months (around 06/05/2024) for routine chronic illness f/u.  Signed:  Santiago Bumpers, MD           03/05/2024

## 2024-05-04 ENCOUNTER — Other Ambulatory Visit: Payer: Self-pay | Admitting: Family Medicine

## 2024-05-04 NOTE — Telephone Encounter (Signed)
 Copied from CRM [id number]. Topic: Clinical - Medication Refill >> May 04, 2024  4:21 PM [place] J wrote: Medication: fluticasone -salmeterol (ADVAIR DISKUS) 250-50 MCG/ACT AEPB  Has the patient contacted their pharmacy? Yes (Agent: If no, request that the patient contact the pharmacy for the refill. If patient does not wish to contact the pharmacy document the reason why and proceed with request.) (Agent: If yes, when and what did the pharmacy advise?) Pharmacy did not have refills for patient.  This is the patient's preferred pharmacy:  [id number] - [place], [place] - [address] [place] [address] Phone: [phone] Fax: [phone]  Is this the correct pharmacy for this prescription? Yes If no, delete pharmacy and type the correct one.   Has the prescription been filled recently? No  Is the patient out of the medication? Yes  Has the patient been seen for an appointment in the last year OR does the patient have an upcoming appointment? Yes  Can we respond through MyChart? Yes  Agent: Please be advised that Rx refills may take up to 3 business days. We ask that you follow-up with your pharmacy.

## 2024-05-07 MED ORDER — FLUTICASONE-SALMETEROL 250-50 MCG/ACT IN AEPB: 1.0000 | INHALATION_SPRAY | Freq: Two times a day (BID) | RESPIRATORY_TRACT | 1 refills | Status: DC

## 2024-06-06 ENCOUNTER — Encounter: Payer: Self-pay | Admitting: Family Medicine

## 2024-06-06 ENCOUNTER — Ambulatory Visit: Admitting: Family Medicine

## 2024-06-06 VITALS — BP 134/91 | HR 67 | Temp 98.9°F | Ht 66.5 in | Wt 186.0 lb

## 2024-06-06 DIAGNOSIS — Z905 Acquired absence of kidney: Secondary | ICD-10-CM

## 2024-06-06 DIAGNOSIS — J45991 Cough variant asthma: Secondary | ICD-10-CM | POA: Diagnosis not present

## 2024-06-06 DIAGNOSIS — R82998 Other abnormal findings in urine: Secondary | ICD-10-CM

## 2024-06-06 DIAGNOSIS — I10 Essential (primary) hypertension: Secondary | ICD-10-CM | POA: Diagnosis not present

## 2024-06-06 LAB — POCT URINALYSIS DIPSTICK
Bilirubin, UA: NEGATIVE
Blood, UA: NEGATIVE
Glucose, UA: NEGATIVE
Ketones, UA: NEGATIVE
Leukocytes, UA: NEGATIVE
Nitrite, UA: NEGATIVE
Protein, UA: POSITIVE — AB
Spec Grav, UA: >=1.03 — AB (ref 1.010–1.025)
Urobilinogen, UA: 0.2 U/dL
pH, UA: 6 (ref 5.0–8.0)

## 2024-06-06 MED ORDER — AMLODIPINE BESYLATE 5 MG PO TABS: 5.0000 mg | ORAL_TABLET | Freq: Every day | ORAL | 0 refills | Status: DC

## 2024-06-06 NOTE — Progress Notes (Signed)
 OFFICE VISIT  06/06/2024  CC:  Chief Complaint  Patient presents with   Medical Management of Chronic Issues   Patient is a 33 y.o. male who presents accompanied by his fiance for 35-month follow-up allergic rhinitis and asthma. A/P as of last visit: 1 cough variant asthma. Improved significantly. Continue Advair 500/50, 1 puff twice daily for at least the next month.  Then stepdown to 250/51 puff twice daily. Discontinue omeprazole  and if he gets pretty quick return of dry cough he will restart this.   2.  Allergic rhinitis, improved on Flonase . He will continue this as well as Xyzal 5 mg a day and Singulair 10 mg a day. Encouraged the use of saline nasal spray daily as well.   He wants to go ahead and hold off on allergist referral at this time.  INTERIM HX: He ran out of his inhaler approximately a month ago and has not had any return of cough.  His chest feels normal.  He has no allergic rhinitis symptoms. No reflux symptoms.  He is concerned because he has noted foamy urine.  Question of more odor lately. Color varies normally with hydration status. No dysuria, hematuria, or urgency. No polyuria or polydipsia.  No headaches, visual abnormalities, chest pain, shortness of breath, or lower extremity swelling.  His girlfriend is checking his blood pressure fairly regularly and it is always in the 140s to 150s over 80s to 90s.  He has a history of a left nephrectomy in infancy, not entirely clear why.  Past Medical History:  Diagnosis Date   Asthma    Hepatic steatosis    noted on CT 2021   History of left nephrectomy    infancy    Past Surgical History:  Procedure Laterality Date   NEPHRECTOMY  2013   WISDOM TOOTH EXTRACTION  2018    Outpatient Medications Prior to Visit  Medication Sig Dispense Refill   fluticasone  (FLONASE ) 50 MCG/ACT nasal spray Place 2 sprays into both nostrils daily. (Patient not taking: Reported on 06/06/2024) 16 g 6    fluticasone -salmeterol (ADVAIR DISKUS) 250-50 MCG/ACT AEPB Inhale 1 puff into the lungs in the morning and at bedtime. (Patient not taking: Reported on 06/06/2024) 30 each 1   levocetirizine (XYZAL ALLERGY 24HR) 5 MG tablet Take 5 mg by mouth daily. (Patient not taking: Reported on 06/06/2024)     montelukast (SINGULAIR) 10 MG tablet Take 10 mg by mouth daily. (Patient not taking: Reported on 06/06/2024)     omeprazole  (PRILOSEC) 40 MG capsule Take 1 capsule (40 mg total) by mouth daily. (Patient not taking: Reported on 06/06/2024) 30 capsule 3   No facility-administered medications prior to visit.    No Known Allergies  Review of Systems As per HPI  PE:    06/06/2024    2:45 PM 03/05/2024    1:49 PM 02/13/2024   10:53 AM  Vitals with BMI  Height 5' 6.5 5' 6.5 5' 6.5  Weight 186 lbs 187 lbs 10 oz 186 lbs  BMI 29.57 29.83 29.57  Systolic 134 132 161  Diastolic 91 95 87  Pulse 67 80 83    Physical Exam  Gen: Alert, well appearing.  Patient is oriented to person, place, time, and situation. AFFECT: pleasant, lucid thought and speech. CV: RRR, no m/r/g.   LUNGS: CTA bilat, nonlabored resps, good aeration in all lung fields. EXT: no clubbing or cyanosis.  no edema.   LABS:  Last CBC Lab Results  Component Value Date  WBC 8.3 04/19/2020   HGB 15.4 04/19/2020   HCT 44.6 04/19/2020   MCV 85.0 04/19/2020   MCH 29.3 04/19/2020   RDW 13.3 04/19/2020   PLT 231 04/19/2020   Last metabolic panel Lab Results  Component Value Date   GLUCOSE 99 04/19/2020   NA 137 04/19/2020   K 3.9 04/19/2020   CL 102 04/19/2020   CO2 26 04/19/2020   BUN 17 04/19/2020   CREATININE 1.03 04/19/2020   GFRNONAA >60 04/19/2020   CALCIUM 9.8 04/19/2020   PROT 7.6 04/07/2018   ALBUMIN 4.5 04/07/2018   BILITOT 1.2 04/07/2018   ALKPHOS 76 04/07/2018   AST 41 04/07/2018   ALT 60 04/07/2018   ANIONGAP 9 04/19/2020   IMPRESSION AND PLAN:  #1 cough variant asthma. All symptoms resolved.  He is  doing well off of any inhalers.  2.  Hypertension.  He has never been treated. Start amlodipine 5 mg a day. Checking basic lab panel and UA with urine microalbumin/creatinine today.  3.  Foamy urine. 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 UA today with trace protein. Check urine microalbumin/creatinine. Labs as per #2 above.  An After Visit Summary was printed and given to the patient.  FOLLOW UP: Return for 10-14d f/u HTN.  Signed:  Arletha Lady, MD           06/06/2024

## 2024-06-07 LAB — LIPID PANEL
Cholesterol: 212 mg/dL — ABNORMAL HIGH (ref 0–200)
HDL: 33.9 mg/dL — ABNORMAL LOW (ref >39.00)
LDL Cholesterol: 140 mg/dL — ABNORMAL HIGH (ref 0–99)
NonHDL: 178.37
Total CHOL/HDL Ratio: 6
Triglycerides: 193 mg/dL — ABNORMAL HIGH (ref 0.0–149.0)
VLDL: 38.6 mg/dL (ref 0.0–40.0)

## 2024-06-07 LAB — CBC WITH DIFFERENTIAL/PLATELET
Basophils Absolute: 0 10*3/uL (ref 0.0–0.1)
Basophils Relative: 0.5 % (ref 0.0–3.0)
Eosinophils Absolute: 0.2 10*3/uL (ref 0.0–0.7)
Eosinophils Relative: 3.1 % (ref 0.0–5.0)
HCT: 46.6 % (ref 39.0–52.0)
Hemoglobin: 15.5 g/dL (ref 13.0–17.0)
Lymphocytes Relative: 40.1 % (ref 12.0–46.0)
Lymphs Abs: 3 10*3/uL (ref 0.7–4.0)
MCHC: 33.3 g/dL (ref 30.0–36.0)
MCV: 84.6 fl (ref 78.0–100.0)
Monocytes Absolute: 0.6 10*3/uL (ref 0.1–1.0)
Monocytes Relative: 7.5 % (ref 3.0–12.0)
Neutro Abs: 3.6 10*3/uL (ref 1.4–7.7)
Neutrophils Relative %: 48.8 % (ref 43.0–77.0)
Platelets: 247 10*3/uL (ref 150.0–400.0)
RBC: 5.5 Mil/uL (ref 4.22–5.81)
RDW: 13.7 % (ref 11.5–15.5)
WBC: 7.5 10*3/uL (ref 4.0–10.5)

## 2024-06-07 LAB — COMPREHENSIVE METABOLIC PANEL WITH GFR
ALT: 52 U/L (ref 0–53)
AST: 31 U/L (ref 0–37)
Albumin: 4.8 g/dL (ref 3.5–5.2)
Alkaline Phosphatase: 64 U/L (ref 39–117)
BUN: 16 mg/dL (ref 6–23)
CO2: 32 meq/L (ref 19–32)
Calcium: 9.8 mg/dL (ref 8.4–10.5)
Chloride: 103 meq/L (ref 96–112)
Creatinine, Ser: 1.12 mg/dL (ref 0.40–1.50)
GFR: 87.4 mL/min (ref >60.00)
Glucose, Bld: 93 mg/dL (ref 70–99)
Potassium: 4.4 meq/L (ref 3.5–5.1)
Sodium: 140 meq/L (ref 135–145)
Total Bilirubin: 1.3 mg/dL — ABNORMAL HIGH (ref 0.2–1.2)
Total Protein: 7.5 g/dL (ref 6.0–8.3)

## 2024-06-07 LAB — MICROALBUMIN / CREATININE URINE RATIO
Creatinine,U: 260.3 mg/dL
Microalb Creat Ratio: 7 mg/g (ref 0.0–30.0)
Microalb, Ur: 1.8 mg/dL (ref 0.0–1.9)

## 2024-06-07 LAB — HEMOGLOBIN A1C: Hgb A1c MFr Bld: 5.7 % (ref 4.6–6.5)

## 2024-06-11 ENCOUNTER — Ambulatory Visit: Payer: Self-pay | Admitting: Family Medicine

## 2024-06-11 ENCOUNTER — Encounter: Payer: Self-pay | Admitting: Family Medicine

## 2024-06-21 ENCOUNTER — Ambulatory Visit: Admitting: Family Medicine

## 2024-06-28 ENCOUNTER — Other Ambulatory Visit: Payer: Self-pay | Admitting: Family Medicine

## 2024-06-29 ENCOUNTER — Encounter: Payer: Self-pay | Admitting: Family Medicine

## 2024-06-29 ENCOUNTER — Ambulatory Visit: Admitting: Family Medicine

## 2024-06-29 VITALS — BP 126/88 | HR 85 | Temp 98.5°F | Resp 14 | Ht 66.5 in | Wt 189.6 lb

## 2024-06-29 DIAGNOSIS — E782 Mixed hyperlipidemia: Secondary | ICD-10-CM

## 2024-06-29 DIAGNOSIS — I10 Essential (primary) hypertension: Secondary | ICD-10-CM

## 2024-06-29 DIAGNOSIS — R82998 Other abnormal findings in urine: Secondary | ICD-10-CM | POA: Diagnosis not present

## 2024-06-29 MED ORDER — AMLODIPINE BESYLATE 10 MG PO TABS: 10.0000 mg | ORAL_TABLET | Freq: Every day | ORAL | 1 refills | Status: DC

## 2024-06-29 NOTE — Progress Notes (Signed)
 OFFICE VISIT  06/29/2024  CC:  Chief Complaint  Patient presents with   Medical Management of Chronic Issues    2 week f/u HTN    Patient is a 33 y.o. male who presents for 3-week follow-up hypertension. #1 cough variant asthma. All symptoms resolved.  He is doing well off of any inhalers.   2.  Hypertension.  He has never been treated. Start amlodipine  5 mg a day. Checking basic lab panel and UA with urine microalbumin/creatinine today.   3.  Foamy urine. 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 UA today with trace protein. Check urine microalbumin/creatinine. Labs as per #2 above.  INTERIM HX: He is feeling well. No side effects from the medication. He checked his blood pressure about 10 days after starting the amlodipine  and recalls it was around 135/80. No BP checks since that time.  Still concerned with foamy urine. He says he went a couple of days in a row without it but then it seems to have returned. He does not see blood in the urine and it does not appear overly concentrated. No urgency, dysuria, unusual urinary frequency, or nocturia.  ROS as above, plus--> no fevers, no CP, no SOB, no wheezing, no cough, no dizziness, no HAs, no rashes, no melena/hematochezia.   No myalgias or arthralgias.  No focal weakness, paresthesias, or tremors.  No acute vision or hearing abnormalities.  No recent changes in lower legs. No n/v/d or abd pain.  No palpitations.    Past Medical History:  Diagnosis Date   Asthma    Hepatic steatosis    noted on CT 2021   History of left nephrectomy    infancy   Hypertension    Mixed hyperlipidemia     Past Surgical History:  Procedure Laterality Date   NEPHRECTOMY  2013   WISDOM TOOTH EXTRACTION  2018    Outpatient Medications Prior to Visit  Medication Sig Dispense Refill   amLODipine  (NORVASC ) 5 MG tablet Take 1 tablet (5 mg total) by mouth daily. 30 tablet 0   No facility-administered  medications prior to visit.    No Known Allergies  Review of Systems As per HPI  PE:    06/29/2024   10:32 AM 06/06/2024    2:45 PM 03/05/2024    1:49 PM  Vitals with BMI  Height 5' 6.5 5' 6.5 5' 6.5  Weight 189 lbs 10 oz 186 lbs 187 lbs 10 oz  BMI 30.15 29.57 29.83  Systolic 126 134 867  Diastolic 88 91 95  Pulse 85 67 80     Physical Exam  Gen: Alert, well appearing.  Patient is oriented to person, place, time, and situation. AFFECT: pleasant, lucid thought and speech. No further exam today  LABS:  Last CBC Lab Results  Component Value Date   WBC 7.5 06/06/2024   HGB 15.5 06/06/2024   HCT 46.6 06/06/2024   MCV 84.6 06/06/2024   MCH 29.3 04/19/2020   RDW 13.7 06/06/2024   PLT 247.0 06/06/2024   Last metabolic panel Lab Results  Component Value Date   GLUCOSE 93 06/06/2024   NA 140 06/06/2024   K 4.4 06/06/2024   CL 103 06/06/2024   CO2 32 06/06/2024   BUN 16 06/06/2024   CREATININE 1.12 06/06/2024   GFR 87.40 06/06/2024   CALCIUM 9.8 06/06/2024   PROT 7.5 06/06/2024   ALBUMIN 4.8 06/06/2024   BILITOT 1.3 (H) 06/06/2024   ALKPHOS 64 06/06/2024  AST 31 06/06/2024   ALT 52 06/06/2024   ANIONGAP 9 04/19/2020   Last lipids Lab Results  Component Value Date   CHOL 212 (H) 06/06/2024   HDL 33.90 (L) 06/06/2024   LDLCALC 140 (H) 06/06/2024   TRIG 193.0 (H) 06/06/2024   CHOLHDL 6 06/06/2024   Last hemoglobin A1c Lab Results  Component Value Date   HGBA1C 5.7 06/06/2024   IMPRESSION AND PLAN:  #1 uncontrolled hypertension, improving. Increase amlodipine  to 10 mg a day. Continue occasional home BP check.  2.  Mixed hyperlipidemia. We reviewed his labs from 06/06/2024 in detail.  LDL 140, HDL 34, triglycerides 193--> he says all of these are improved compared to prior measurements at his prior PCP. He maintains a good low-carb diet and is trying to make further improvements in avoiding red meat and overall limiting calories.  #3 foamy  urine. Urine microalbumin/creatinine normal last visit.  Creatinine/GFR normal. Solitary kidney. I do not know the cause of his foamy urine but reassured him at this time. Discussed possible nephrologist referral but will hold off at this time. Will recheck urine and basic metabolic panel at next follow-up.  An After Visit Summary was printed and given to the patient.  FOLLOW UP: Return in about 4 weeks (around 07/27/2024) for f/u HTN.  Signed:  Gerlene Hockey, MD           06/29/2024

## 2024-07-25 ENCOUNTER — Other Ambulatory Visit: Payer: Self-pay | Admitting: Family Medicine

## 2024-07-27 ENCOUNTER — Ambulatory Visit: Admitting: Family Medicine

## 2024-08-28 ENCOUNTER — Other Ambulatory Visit: Payer: Self-pay | Admitting: Family Medicine

## 2024-09-03 ENCOUNTER — Ambulatory Visit: Admitting: Family Medicine

## 2024-09-03 ENCOUNTER — Encounter: Payer: Self-pay | Admitting: Family Medicine

## 2024-09-03 VITALS — BP 120/78 | HR 85 | Temp 98.8°F | Ht 66.5 in | Wt 189.6 lb

## 2024-09-03 DIAGNOSIS — I10 Essential (primary) hypertension: Secondary | ICD-10-CM

## 2024-09-03 MED ORDER — AMLODIPINE BESYLATE 10 MG PO TABS: 10.0000 mg | ORAL_TABLET | Freq: Every day | ORAL | 1 refills | Status: AC

## 2024-09-03 NOTE — Progress Notes (Signed)
 OFFICE VISIT  09/03/2024  CC:  Chief Complaint  Patient presents with   Hypertension    Patient is a 33 y.o. male who presents accompanied by his wife for 85-month follow-up hypertension. A/P as of last visit: #1 uncontrolled hypertension, improving. Increase amlodipine  to 10 mg a day. Continue occasional home BP check.   2.  Mixed hyperlipidemia. We reviewed his labs from 06/06/2024 in detail.  LDL 140, HDL 34, triglycerides 193--> he says all of these are improved compared to prior measurements at his prior PCP. He maintains a good low-carb diet and is trying to make further improvements in avoiding red meat and overall limiting calories.   #3 foamy urine. Urine microalbumin/creatinine normal last visit.  Creatinine/GFR normal. Solitary kidney. I do not know the cause of his foamy urine but reassured him at this time. Discussed possible nephrologist referral but will hold off at this time. Will recheck urine and basic metabolic panel at next follow-up.  INTERIM HX: Blood pressures have been normal. He had a bit of flushing in the lower legs on 1 occasion when walking in [place]. Otherwise he does not feel like the amlodipine  has caused any side effects.  He had to use his inhaler for some chest tightness/dry cough yesterday.  His allergies have been acting up some lately. He is taking Xyzal daily and started Flonase  yesterday.  Review of systems: No headache, no sore throat, no fever, no dizziness, no extremity swelling, no palpitations, no shortness of breath.  Past Medical History:  Diagnosis Date   Asthma    Hepatic steatosis    noted on CT 2021   History of left nephrectomy    infancy   Hypertension    Mixed hyperlipidemia     Past Surgical History:  Procedure Laterality Date   NEPHRECTOMY  2013   WISDOM TOOTH EXTRACTION  2018    Outpatient Medications Prior to Visit  Medication Sig Dispense Refill   amLODipine  (NORVASC ) 10 MG tablet Take 1 tablet (10 mg  total) by mouth daily. 30 tablet 1   No facility-administered medications prior to visit.    No Known Allergies  Review of Systems As per HPI  PE:    09/03/2024   10:15 AM 06/29/2024   10:32 AM 06/06/2024    2:45 PM  Vitals with BMI  Height 5' 6.5 5' 6.5 5' 6.5  Weight 189 lbs 10 oz 189 lbs 10 oz 186 lbs  BMI 30.15 30.15 29.57  Systolic 120 126 865  Diastolic 78 88 91  Pulse 85 85 67     Physical Exam  Gen: Alert, well appearing.  Patient is oriented to person, place, time, and situation. AFFECT: pleasant, lucid thought and speech. CV: RRR, no m/r/g.   LUNGS: CTA bilat, nonlabored resps, good aeration in all lung fields. EXT: no clubbing or cyanosis.  no edema.   LABS:  Last CBC Lab Results  Component Value Date   WBC 7.5 06/06/2024   HGB 15.5 06/06/2024   HCT 46.6 06/06/2024   MCV 84.6 06/06/2024   MCH 29.3 04/19/2020   RDW 13.7 06/06/2024   PLT 247.0 06/06/2024   Last metabolic panel Lab Results  Component Value Date   GLUCOSE 93 06/06/2024   NA 140 06/06/2024   K 4.4 06/06/2024   CL 103 06/06/2024   CO2 32 06/06/2024   BUN 16 06/06/2024   CREATININE 1.12 06/06/2024   GFR 87.40 06/06/2024   CALCIUM 9.8 06/06/2024   PROT 7.5 06/06/2024  ALBUMIN 4.8 06/06/2024   BILITOT 1.3 (H) 06/06/2024   ALKPHOS 64 06/06/2024   AST 31 06/06/2024   ALT 52 06/06/2024   ANIONGAP 9 04/19/2020   Last lipids Lab Results  Component Value Date   CHOL 212 (H) 06/06/2024   HDL 33.90 (L) 06/06/2024   LDLCALC 140 (H) 06/06/2024   TRIG 193.0 (H) 06/06/2024   CHOLHDL 6 06/06/2024   Last hemoglobin A1c Lab Results  Component Value Date   HGBA1C 5.7 06/06/2024   IMPRESSION AND PLAN:  #1 hypertension, well-controlled now on amlodipine  10 mg a day. Continue this and continue low-sodium diet.  2. cough variant asthma. He is currently starting to treat his allergies daily and has an albuterol  inhaler to use as needed.  An After Visit Summary was printed and  given to the patient.  FOLLOW UP: Return in about 6 months (around 03/03/2025) for routine chronic illness f/u.  Signed:  Gerlene Hockey, MD           09/03/2024

## 2024-09-14 ENCOUNTER — Ambulatory Visit: Admitting: Family Medicine

## 2025-02-25 ENCOUNTER — Ambulatory Visit: Admitting: Family Medicine
